# Patient Record
Sex: Male | Born: 1965 | Race: White | Hispanic: No | Marital: Married | Smoking: Current every day smoker
Health system: Southern US, Community
[De-identification: ages and names within clinical notes are randomized; demographics above are authoritative.]

## PROBLEM LIST (undated history)

## (undated) DIAGNOSIS — I209 Angina pectoris, unspecified: Secondary | ICD-10-CM

## (undated) DIAGNOSIS — E7800 Pure hypercholesterolemia, unspecified: Secondary | ICD-10-CM

## (undated) DIAGNOSIS — I10 Essential (primary) hypertension: Secondary | ICD-10-CM

## (undated) DIAGNOSIS — K219 Gastro-esophageal reflux disease without esophagitis: Secondary | ICD-10-CM

## (undated) DIAGNOSIS — I2510 Atherosclerotic heart disease of native coronary artery without angina pectoris: Secondary | ICD-10-CM

## (undated) DIAGNOSIS — E119 Type 2 diabetes mellitus without complications: Secondary | ICD-10-CM

## (undated) HISTORY — DX: Atherosclerotic heart disease of native coronary artery without angina pectoris: I25.10

## (undated) HISTORY — PX: NO PAST SURGERIES: SHX2092

---

## 2015-10-30 ENCOUNTER — Emergency Department (HOSPITAL_COMMUNITY): Payer: Self-pay

## 2015-10-30 ENCOUNTER — Encounter (HOSPITAL_COMMUNITY): Payer: Self-pay | Admitting: Emergency Medicine

## 2015-10-30 ENCOUNTER — Emergency Department (HOSPITAL_COMMUNITY)
Admission: EM | Admit: 2015-10-30 | Discharge: 2015-10-31 | Disposition: A | Discharge status: Home / Self Care | Payer: Self-pay | Attending: Emergency Medicine | Admitting: Emergency Medicine

## 2015-10-30 DIAGNOSIS — F17210 Nicotine dependence, cigarettes, uncomplicated: Secondary | ICD-10-CM | POA: Insufficient documentation

## 2015-10-30 DIAGNOSIS — L03211 Cellulitis of face: Secondary | ICD-10-CM | POA: Insufficient documentation

## 2015-10-30 DIAGNOSIS — I10 Essential (primary) hypertension: Secondary | ICD-10-CM | POA: Insufficient documentation

## 2015-10-30 DIAGNOSIS — Z792 Long term (current) use of antibiotics: Secondary | ICD-10-CM | POA: Insufficient documentation

## 2015-10-30 HISTORY — DX: Essential (primary) hypertension: I10

## 2015-10-30 LAB — COMPREHENSIVE METABOLIC PANEL
ALT: 29 U/L (ref 17–63)
AST: 17 U/L (ref 15–41)
Albumin: 3.5 g/dL (ref 3.5–5.0)
Alkaline Phosphatase: 98 U/L (ref 38–126)
Anion gap: 8 (ref 5–15)
BILIRUBIN TOTAL: 0.3 mg/dL (ref 0.3–1.2)
BUN: 8 mg/dL (ref 6–20)
CO2: 29 mmol/L (ref 22–32)
Calcium: 8.9 mg/dL (ref 8.9–10.3)
Chloride: 102 mmol/L (ref 101–111)
Creatinine, Ser: 0.94 mg/dL (ref 0.61–1.24)
Glucose, Bld: 157 mg/dL — ABNORMAL HIGH (ref 65–99)
POTASSIUM: 4 mmol/L (ref 3.5–5.1)
Sodium: 139 mmol/L (ref 135–145)
TOTAL PROTEIN: 6.7 g/dL (ref 6.5–8.1)

## 2015-10-30 LAB — CBC
HEMATOCRIT: 44.4 % (ref 39.0–52.0)
Hemoglobin: 15.3 g/dL (ref 13.0–17.0)
MCH: 29.7 pg (ref 26.0–34.0)
MCHC: 34.5 g/dL (ref 30.0–36.0)
MCV: 86 fL (ref 78.0–100.0)
PLATELETS: 281 10*3/uL (ref 150–400)
RBC: 5.16 MIL/uL (ref 4.22–5.81)
RDW: 13.2 % (ref 11.5–15.5)
WBC: 10.6 10*3/uL — AB (ref 4.0–10.5)

## 2015-10-30 MED ORDER — IOHEXOL 300 MG/ML  SOLN
100.0000 mL | Freq: Once | INTRAMUSCULAR | Status: AC | PRN
  Administered 2015-10-30: 100 mL via INTRAVENOUS

## 2015-10-30 MED ORDER — HYDROMORPHONE HCL 1 MG/ML IJ SOLN
1.0000 mg | Freq: Once | INTRAMUSCULAR | Status: AC
  Administered 2015-10-30: 1 mg via INTRAVENOUS
  Filled 2015-10-30: qty 1

## 2015-10-30 MED ORDER — VANCOMYCIN HCL IN DEXTROSE 1-5 GM/200ML-% IV SOLN
1000.0000 mg | Freq: Once | INTRAVENOUS | Status: AC
  Administered 2015-10-30: 1000 mg via INTRAVENOUS
  Filled 2015-10-30: qty 200

## 2015-10-30 NOTE — ED Notes (Signed)
Pt states two days ago he started having pain on the right side of his nose and over the past day the swelling has moved over on the right side of his face and under his eye. Pt was seen at urgent care today and given antibiotics  And told it was a staph infection. Pt here because pain is worse.

## 2015-10-30 NOTE — ED Notes (Signed)
Patient transported to CT 

## 2015-10-30 NOTE — ED Notes (Signed)
Pt transported to CT ?

## 2015-10-30 NOTE — ED Provider Notes (Signed)
CSN: [id number]     Arrival date & time 10/30/15  1908 History   First MD Initiated Contact with Patient 10/30/15 2130     Chief Complaint  Patient presents with  . Facial Swelling    HPI Patient presents to the emergency room with complaints of right-sided facial pain.  Patient states in the past day or so he noticed a bump on the inside of his right nostril but has been painful and tender. Over the past day it has been increasing in size and now he is having swelling involving the right side of his face as well as underneath his eye. He went to an urgent care today and was told he had a staph infection and was prescribed antibiotics. She feels like the symptoms have been getting worse and is having increasing pain so he came into the emergency room for evaluation. As any fevers or chills. No difficulty breathing. Past Medical History  Diagnosis Date  . Hypertension    History reviewed. No pertinent past surgical history. No family history on file. Social History  Substance Use Topics  . Smoking status: Current Every Day Smoker -- 1.00 packs/day    Types: Cigarettes  . Smokeless tobacco: None  . Alcohol Use: No    Review of Systems  All other systems reviewed and are negative.     Allergies  Review of patient's allergies indicates no known allergies.  Home Medications   Prior to Admission medications   Medication Sig Start Date End Date Taking? Authorizing Provider  mupirocin ointment (BACTROBAN) 2 % Place 1 application into the nose 2 (two) times daily as needed (FOR IRRITATION).   Yes Historical Provider, MD  sulfamethoxazole-trimethoprim (BACTRIM DS,SEPTRA DS) 800-160 MG tablet Take 1 tablet by mouth 2 (two) times daily.   Yes Historical Provider, MD  naproxen (NAPROSYN) 500 MG tablet Take 1 tablet (500 mg total) by mouth 2 (two) times daily. 10/31/15   Linwood DibblesJon Shandale Malak, MD  oxyCODONE-acetaminophen (PERCOCET/ROXICET) 5-325 MG tablet Take 1-2 tablets by mouth every 6 (six) hours as  needed. 10/31/15   Linwood DibblesJon Sharol Croghan, MD   BP 140/99 mmHg  Pulse 84  Temp(Src) 97.5 F (36.4 C) (Oral)  Resp 18  Ht 5\' 7"  (1.702 m)  Wt 109.77 kg  BMI 37.89 kg/m2  SpO2 97% Physical Exam  Constitutional: He appears well-developed and well-nourished. No distress.  HENT:  Head: Normocephalic and atraumatic.  Right Ear: External ear normal.  Left Ear: External ear normal.  Eyes: Conjunctivae are normal. Right eye exhibits no discharge. Left eye exhibits no discharge. No scleral icterus.  Neck: Neck supple. No tracheal deviation present.  Cardiovascular: Normal rate, regular rhythm and intact distal pulses.   Pulmonary/Chest: Effort normal and breath sounds normal. No stridor. No respiratory distress. He has no wheezes. He has no rales.  Abdominal: Soft. Bowel sounds are normal. He exhibits no distension. There is no tenderness. There is no rebound and no guarding.  Musculoskeletal: He exhibits no edema or tenderness.  Neurological: He is alert. He has normal strength. No cranial nerve deficit (no facial droop, extraocular movements intact, no slurred speech) or sensory deficit. He exhibits normal muscle tone. He displays no seizure activity. Coordination normal.  Skin: Skin is warm and dry. No rash noted.  Psychiatric: He has a normal mood and affect.  Nursing note and vitals reviewed.   ED Course  Procedures (including critical care time) Labs Review Labs Reviewed  COMPREHENSIVE METABOLIC PANEL - Abnormal; Notable for the following:  Glucose, Bld 157 (*)    All other components within normal limits  CBC - Abnormal; Notable for the following:    WBC 10.6 (*)    All other components within normal limits    Imaging Review Ct Maxillofacial W/cm  10/30/2015  CLINICAL DATA:  Initial evaluation for acute right-sided facial swelling with pain. EXAM: CT MAXILLOFACIAL WITH CONTRAST TECHNIQUE: Multidetector CT imaging of the maxillofacial structures was performed with intravenous contrast.  Multiplanar CT image reconstructions were also generated. A small metallic BB was placed on the right temple in order to reliably differentiate right from left. CONTRAST:  OMNIPAQUE IOHEXOL 300 MG/ML  SOLN COMPARISON:  None. FINDINGS: There is focal soft tissue swelling with inflammatory stranding along the lateral aspect of the nose on the right with extension towards the right nasal vestibule. Lateral extension into the right face/infraorbital soft tissues. Overlying skin thickening. Findings suspicious for cellulitis. No rim enhancing or drainable fluid collection identified. No other significant soft tissue inflammatory changes within the face. Bony structures demonstrate no acute abnormality. Globes and orbits demonstrate no acute abnormality. Mild polypoid opacity within the inferior maxillary sinuses, left greater than right. Paranasal sinuses are otherwise largely clear. Trace right mastoid effusion. Left mastoid air cells are clear. Middle ear cavities clear. Salivary glands including the parotid glands and submandibular glands are normal. No pathologically enlarged lymph nodes within the visualized neck. Visualized portions of the brain demonstrate no acute abnormality. IMPRESSION: Focal soft tissue swelling with inflammatory stranding along the right lateral aspect of the nose/nasal vestibule, with lateral extension into the right face/infraorbital soft tissues. Findings concerning for cellulitis. No abscess or drainable fluid collection identified. No postseptal extension. Electronically Signed   By: Rise Mu M.D.   On: 10/30/2015 23:54   I have personally reviewed and evaluated these images and lab results as part of my medical decision-making.    MDM   Final diagnoses:  Facial cellulitis    Patient CT scan shows evidence of facial cellulitis without abscess. Patient's laboratory tests are reassuring. Times are stable.  On the emergency department he was given a dose of  vancomycin and IV pain medications.  Patient was prescribed Bactrim at the urgent care today. I think this is a reasonable antibiotic to continue at this point. He was given the option of hospitalization for continued pain control since his outpatient treatment and close follow-up. Patient is comfortable with discharge. I will give him a prescription for Percocet since the hydrocodone was not giving him adequate pain control.    Linwood Dibbles, MD 10/31/15 [phone]

## 2015-10-31 MED ORDER — NAPROXEN 500 MG PO TABS: 500.0000 mg | ORAL_TABLET | Freq: Two times a day (BID) | ORAL | Status: DC

## 2015-10-31 MED ORDER — OXYCODONE-ACETAMINOPHEN 5-325 MG PO TABS: 1.0000 | ORAL_TABLET | Freq: Four times a day (QID) | ORAL | Status: DC | PRN

## 2015-10-31 MED ORDER — KETOROLAC TROMETHAMINE 30 MG/ML IJ SOLN
30.0000 mg | Freq: Once | INTRAMUSCULAR | Status: AC
  Administered 2015-10-31: 30 mg via INTRAVENOUS
  Filled 2015-10-31: qty 1

## 2015-10-31 NOTE — Discharge Instructions (Signed)
Cellulitis °Cellulitis is an infection of the skin and the tissue under the skin. The infected area is usually red and tender. This happens most often in the arms and lower legs. °HOME CARE  °· Take your antibiotic medicine as told. Finish the medicine even if you start to feel better. °· Keep the infected arm or leg raised (elevated). °· Put a warm cloth on the area up to 4 times per day. °· Only take medicines as told by your doctor. °· Keep all doctor visits as told. °GET HELP IF: °· You see red streaks on the skin coming from the infected area. °· Your red area gets bigger or turns a dark color. °· Your bone or joint under the infected area is painful after the skin heals. °· Your infection comes back in the same area or different area. °· You have a puffy (swollen) bump in the infected area. °· You have new symptoms. °· You have a fever. °GET HELP RIGHT AWAY IF:  °· You feel very sleepy. °· You throw up (vomit) or have watery poop (diarrhea). °· You feel sick and have muscle aches and pains. °  °This information is not intended to replace advice given to you by your health care provider. Make sure you discuss any questions you have with your health care provider. °  °Document Released: 03/23/2008 Document Revised: 06/26/2015 Document Reviewed: 12/21/2011 °Elsevier Interactive Patient Education ©2016 [employer] ° °

## 2017-05-13 IMAGING — CT CT MAXILLOFACIAL W/ CM
3 of 4 series · 14 of 47 positions shown, 16 images · IV contrast (omnipaque)
Comparison: None.

CLINICAL DATA: Initial evaluation for acute right-sided facial
swelling with pain.

EXAM:
CT MAXILLOFACIAL WITH CONTRAST
TECHNIQUE: Multidetector CT imaging of the maxillofacial structures was
performed with intravenous contrast. Multiplanar CT image
reconstructions were also generated. A small metallic BB was placed
on the right temple in order to reliably differentiate right from
left.
CONTRAST:  100mL OMNIPAQUE IOHEXOL 300 MG/ML  SOLN

[Series 2: facialbone 2.0 st · axial · 0.35mm/px · z∈[-268,-110]mm · 8 of 93 slices shown, 10 images]
[im 7/93  brain]
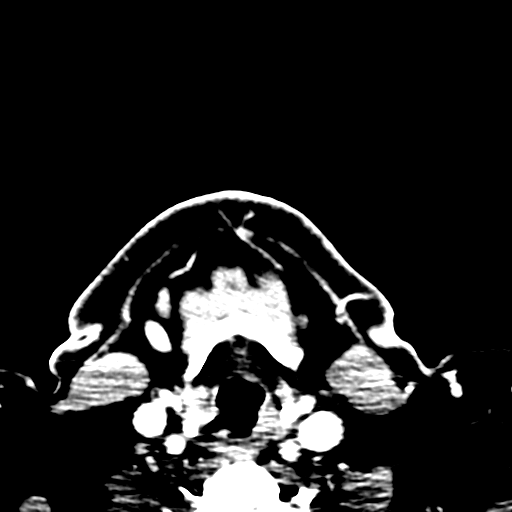
[im 7/93  bone]
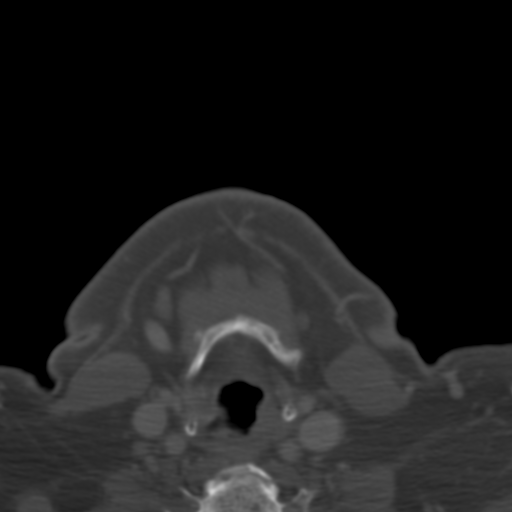
[im 20/93  bone]
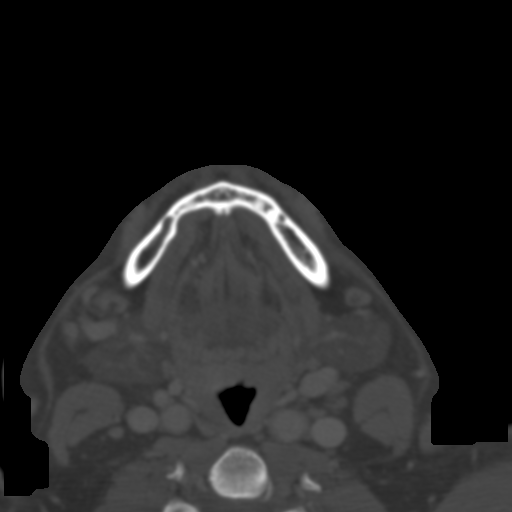
[im 33/93  bone]
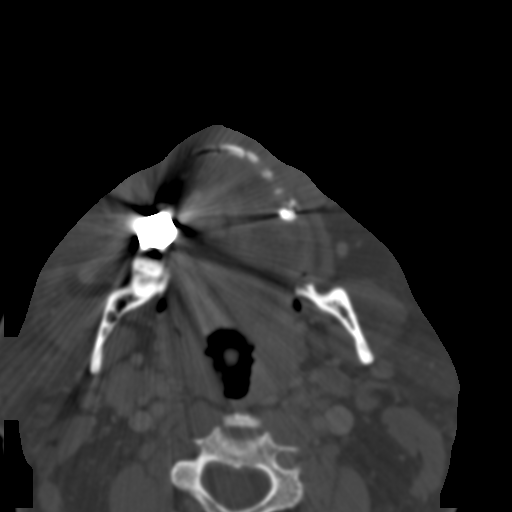
[im 40/93  bone]
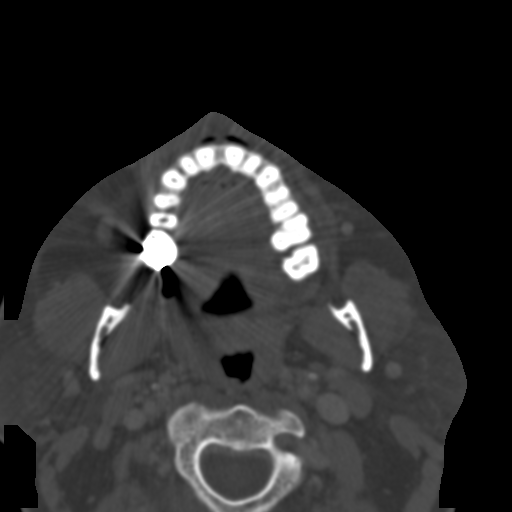
[im 53/93  brain]
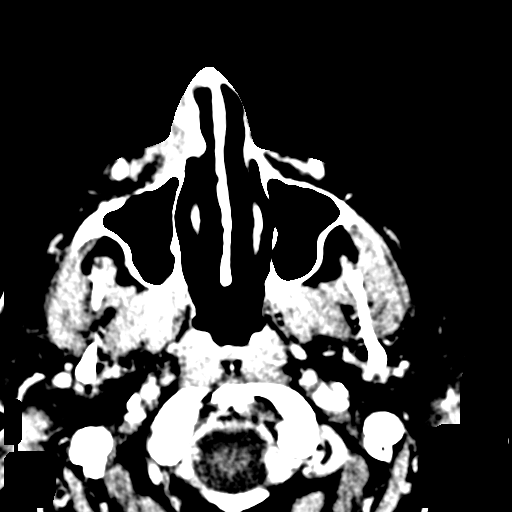
[im 53/93  bone]
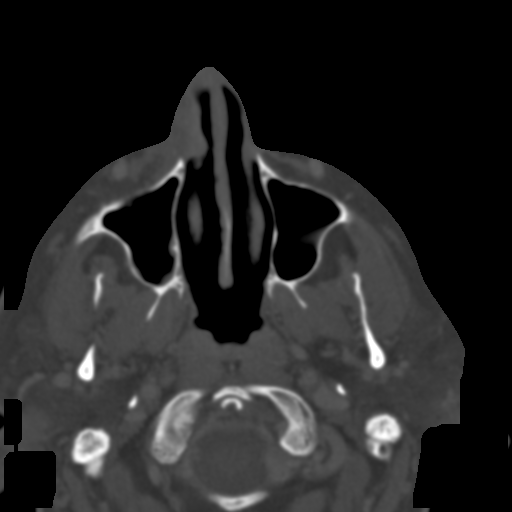
[im 60/93  bone]
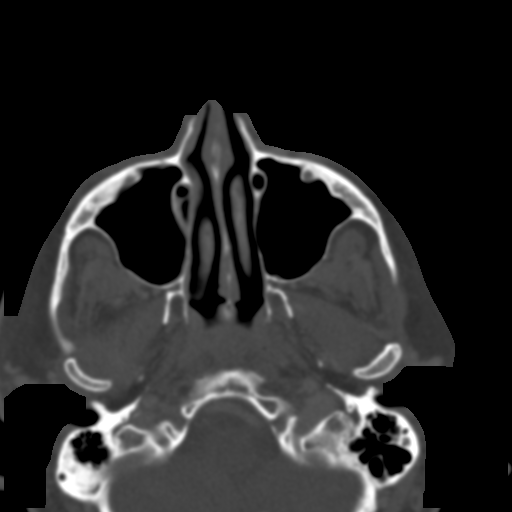
[im 73/93  bone]
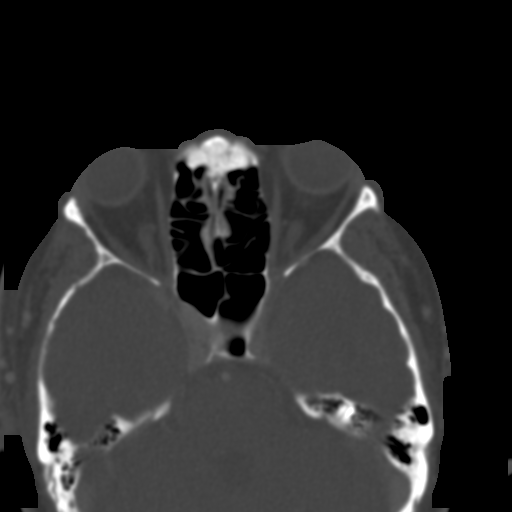
[im 86/93  bone]
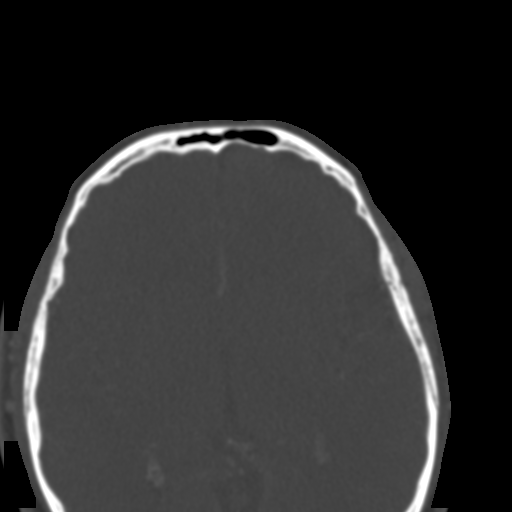

[Series 6: facialbone 2.0 cor st · coronal · 0.36mm/px · 3 of 105 slices shown]
[im 35/105  bone]
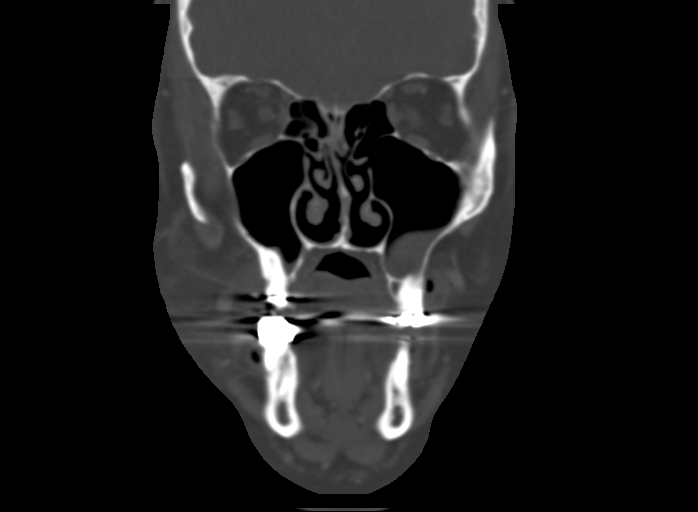
[im 47/105  bone]
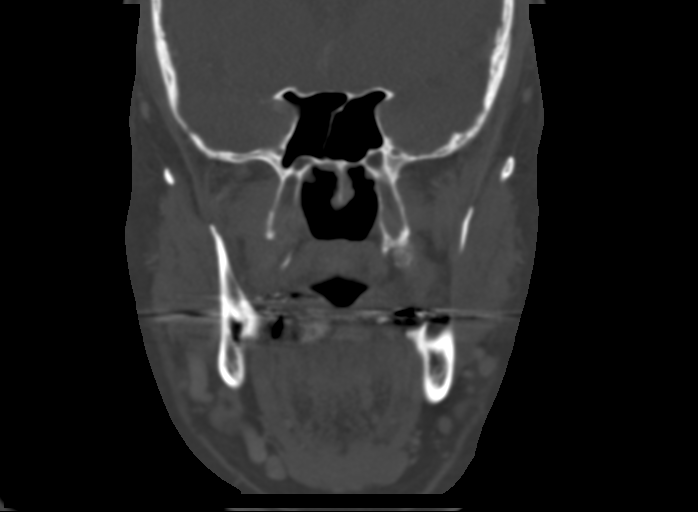
[im 58/105  bone]
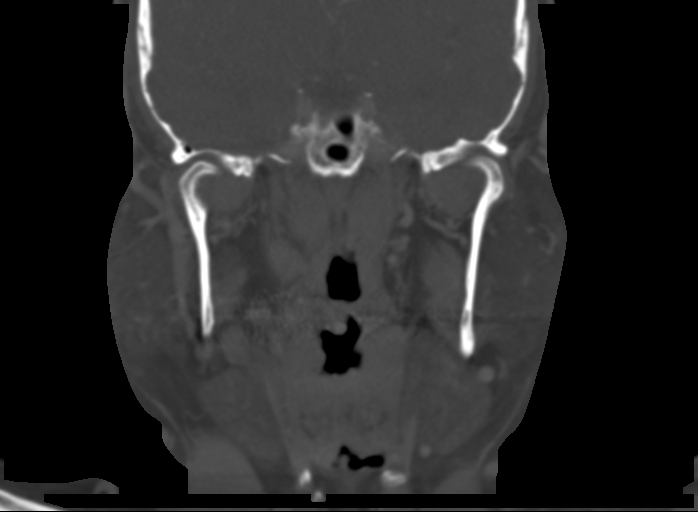

[Series 7: facialbone 2.0 sag st · sagittal · 0.36mm/px · 3 of 98 slices shown]
[im 33/98  bone]
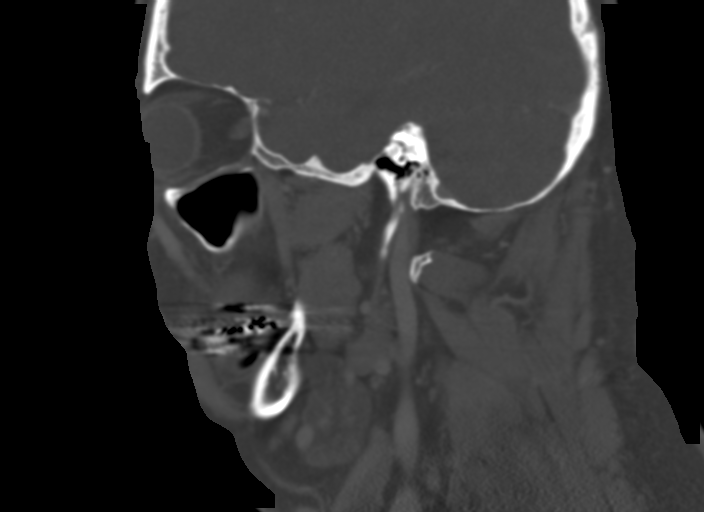
[im 49/98  bone]
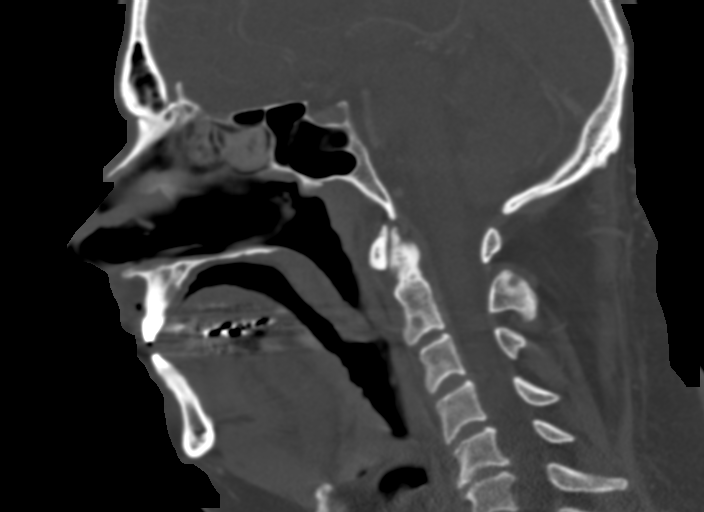
[im 65/98  bone]
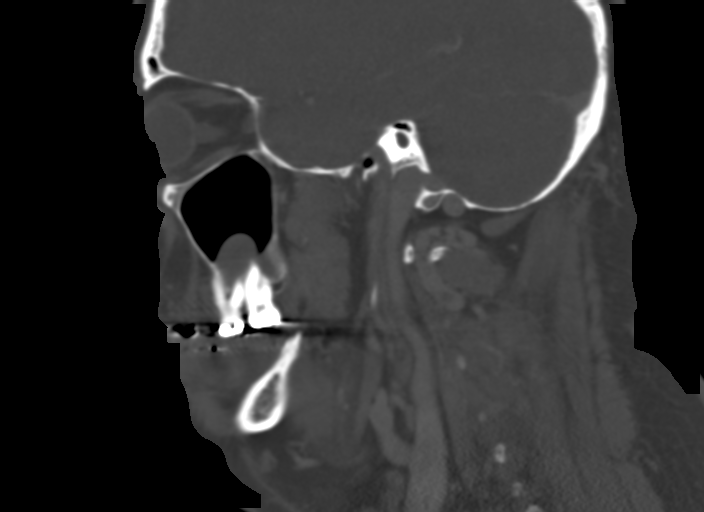

[14 of 47 positions shown; findings below may reference images not displayed]

FINDINGS: There is focal soft tissue swelling with inflammatory stranding
along the lateral aspect of the nose on the right with extension
towards the right nasal vestibule. Lateral extension into the right
face/infraorbital soft tissues. Overlying skin thickening. Findings
suspicious for cellulitis. No rim enhancing or drainable fluid
collection identified. No other significant soft tissue inflammatory
changes within the face.

Bony structures demonstrate no acute abnormality.

Globes and orbits demonstrate no acute abnormality.

Mild polypoid opacity within the inferior maxillary sinuses, left
greater than right. Paranasal sinuses are otherwise largely clear.
Trace right mastoid effusion. Left mastoid air cells are clear.
Middle ear cavities clear.

Salivary glands including the parotid glands and submandibular
glands are normal.

No pathologically enlarged lymph nodes within the visualized neck.

Visualized portions of the brain demonstrate no acute abnormality.
IMPRESSION: Focal soft tissue swelling with inflammatory stranding along the
right lateral aspect of the nose/nasal vestibule, with lateral
extension into the right face/infraorbital soft tissues. Findings
concerning for cellulitis. No abscess or drainable fluid collection
identified. No postseptal extension.

## 2017-09-08 DIAGNOSIS — I200 Unstable angina: Secondary | ICD-10-CM | POA: Diagnosis not present

## 2017-09-08 DIAGNOSIS — I10 Essential (primary) hypertension: Secondary | ICD-10-CM

## 2017-09-08 DIAGNOSIS — E785 Hyperlipidemia, unspecified: Secondary | ICD-10-CM | POA: Diagnosis not present

## 2017-09-08 DIAGNOSIS — R079 Chest pain, unspecified: Secondary | ICD-10-CM | POA: Diagnosis not present

## 2017-09-08 DIAGNOSIS — R739 Hyperglycemia, unspecified: Secondary | ICD-10-CM

## 2017-09-09 ENCOUNTER — Other Ambulatory Visit: Payer: Self-pay

## 2017-09-09 ENCOUNTER — Encounter (HOSPITAL_COMMUNITY): Payer: Self-pay | Admitting: General Practice

## 2017-09-09 ENCOUNTER — Inpatient Hospital Stay (HOSPITAL_COMMUNITY)
Admission: AD | Admit: 2017-09-09 | Discharge: 2017-09-10 | DRG: 287 | Disposition: A | Discharge status: Home / Self Care | Payer: Medicaid Other | Source: Other Acute Inpatient Hospital | Attending: Internal Medicine | Admitting: Internal Medicine

## 2017-09-09 DIAGNOSIS — F17210 Nicotine dependence, cigarettes, uncomplicated: Secondary | ICD-10-CM | POA: Diagnosis present

## 2017-09-09 DIAGNOSIS — Z7984 Long term (current) use of oral hypoglycemic drugs: Secondary | ICD-10-CM

## 2017-09-09 DIAGNOSIS — Z720 Tobacco use: Secondary | ICD-10-CM

## 2017-09-09 DIAGNOSIS — I200 Unstable angina: Secondary | ICD-10-CM | POA: Diagnosis not present

## 2017-09-09 DIAGNOSIS — I25110 Atherosclerotic heart disease of native coronary artery with unstable angina pectoris: Principal | ICD-10-CM | POA: Diagnosis present

## 2017-09-09 DIAGNOSIS — R072 Precordial pain: Secondary | ICD-10-CM | POA: Diagnosis not present

## 2017-09-09 DIAGNOSIS — K219 Gastro-esophageal reflux disease without esophagitis: Secondary | ICD-10-CM | POA: Diagnosis present

## 2017-09-09 DIAGNOSIS — E785 Hyperlipidemia, unspecified: Secondary | ICD-10-CM

## 2017-09-09 DIAGNOSIS — Z791 Long term (current) use of non-steroidal anti-inflammatories (NSAID): Secondary | ICD-10-CM | POA: Diagnosis not present

## 2017-09-09 DIAGNOSIS — E119 Type 2 diabetes mellitus without complications: Secondary | ICD-10-CM

## 2017-09-09 DIAGNOSIS — Z79899 Other long term (current) drug therapy: Secondary | ICD-10-CM

## 2017-09-09 DIAGNOSIS — I10 Essential (primary) hypertension: Secondary | ICD-10-CM | POA: Diagnosis present

## 2017-09-09 DIAGNOSIS — E7800 Pure hypercholesterolemia, unspecified: Secondary | ICD-10-CM | POA: Diagnosis present

## 2017-09-09 DIAGNOSIS — E1165 Type 2 diabetes mellitus with hyperglycemia: Secondary | ICD-10-CM | POA: Diagnosis present

## 2017-09-09 DIAGNOSIS — R079 Chest pain, unspecified: Secondary | ICD-10-CM | POA: Diagnosis present

## 2017-09-09 HISTORY — DX: Pure hypercholesterolemia, unspecified: E78.00

## 2017-09-09 HISTORY — DX: Angina pectoris, unspecified: I20.9

## 2017-09-09 HISTORY — DX: Type 2 diabetes mellitus without complications: E11.9

## 2017-09-09 HISTORY — DX: Gastro-esophageal reflux disease without esophagitis: K21.9

## 2017-09-09 LAB — GLUCOSE, CAPILLARY: Glucose-Capillary: 235 mg/dL — ABNORMAL HIGH (ref 65–99)

## 2017-09-09 MED ORDER — NITROGLYCERIN 0.4 MG SL SUBL: 0.4000 mg | SUBLINGUAL_TABLET | SUBLINGUAL | Status: DC | PRN

## 2017-09-09 MED ORDER — HEPARIN BOLUS VIA INFUSION
4000.0000 [IU] | Freq: Once | INTRAVENOUS | Status: AC
  Administered 2017-09-09: 4000 [IU] via INTRAVENOUS
  Filled 2017-09-09: qty 4000

## 2017-09-09 MED ORDER — METOPROLOL TARTRATE 12.5 MG HALF TABLET
12.5000 mg | ORAL_TABLET | Freq: Two times a day (BID) | ORAL | Status: DC
  Administered 2017-09-09 – 2017-09-10 (×2): 12.5 mg via ORAL
  Filled 2017-09-09 (×2): qty 1

## 2017-09-09 MED ORDER — HEPARIN (PORCINE) IN NACL 100-0.45 UNIT/ML-% IJ SOLN
1200.0000 [IU]/h | INTRAMUSCULAR | Status: DC
  Administered 2017-09-09 – 2017-09-10 (×2): 1200 [IU]/h via INTRAVENOUS
  Filled 2017-09-09 (×2): qty 250

## 2017-09-09 MED ORDER — ONDANSETRON HCL 4 MG/2ML IJ SOLN: 4.0000 mg | Freq: Four times a day (QID) | INTRAMUSCULAR | Status: DC | PRN

## 2017-09-09 MED ORDER — INSULIN ASPART 100 UNIT/ML ~~LOC~~ SOLN
0.0000 [IU] | Freq: Three times a day (TID) | SUBCUTANEOUS | Status: DC
  Administered 2017-09-10: 5 [IU] via SUBCUTANEOUS
  Administered 2017-09-10: 3 [IU] via SUBCUTANEOUS

## 2017-09-09 MED ORDER — ACETAMINOPHEN 325 MG PO TABS: 650.0000 mg | ORAL_TABLET | ORAL | Status: DC | PRN

## 2017-09-09 MED ORDER — ASPIRIN EC 81 MG PO TBEC
81.0000 mg | DELAYED_RELEASE_TABLET | Freq: Every day | ORAL | Status: DC
  Administered 2017-09-10: 81 mg via ORAL
  Filled 2017-09-09: qty 1

## 2017-09-09 NOTE — H&P (Signed)
History & Physical    Patient ID: Shane Merritt MRN: [id number], DOB/AGE: 50/12/1965   Admit date: 09/09/2017   Primary Physician: Leane CallNodal, Jr Reinaldo, PA-C Primary Cardiologist: New  Patient Profile    51 yo male with PMH of DM, HTN, HL and tobacco use who [facility] with [facility].  Past Medical History   Past Medical History:  Diagnosis Date  . Anginal pain (HCC)   . GERD (gastroesophageal reflux disease)   . Hypercholesterolemia    Hattie Perch/notes 10/18/2011  . Hypertension   . Type II diabetes mellitus (HCC)    "started RX last week" (09/09/2017)    Past Surgical History:  Procedure Laterality Date  . NO PAST SURGERIES       Allergies  No Known Allergies  History of Present Illness    Shane Merritt is 51 yo male with PMH of DM, HTN, HL and tobacco use. Reports he has not followed with a PCP for over a year. Was seen by his PCP last week and placed on antihypertensives, statin and metformin. He was scheduled to follow up in the office yesterday for a blood sugar check. Reports that morning he was loading a truck and developed a sudden onset of SSCP with severe dyspnea, nausea and diaphoresis. He sat down for awhile but the pain continued. States this lasted about 2 hours. He went to the PCP to follow up about his blood sugar and informed PCP of events, [facility] and [facility].   Troponin cycled and negative x3, EKG not on file. CXR was negative. ECHO showed EF of 45-50% with mid lateral, apical lateral WMA. In talking with the patient he reports having episodes of chest pain for several weeks prior to yesterday's, reporting about one a week with exertion. Denies any family hx of CAD.   Home Medications    Prior to Admission medications   Medication Sig Start Date End Date Taking? Authorizing Provider  mupirocin ointment (BACTROBAN) 2 % Place 1 application into the nose 2 (two) times daily as needed (FOR IRRITATION).    [provider]  naproxen (NAPROSYN) 500 MG tablet Take 1 tablet (500 mg total) by mouth 2 (two) times daily. 10/31/15   Linwood DibblesKnapp, Jon, MD  oxyCODONE-acetaminophen (PERCOCET/ROXICET) 5-325 MG tablet Take 1-2 tablets by mouth every 6 (six) hours as needed. 10/31/15   Linwood DibblesKnapp, Jon, MD  sulfamethoxazole-trimethoprim (BACTRIM DS,SEPTRA DS) 800-160 MG tablet Take 1 tablet by mouth 2 (two) times daily.    [provider]    Family History    Family History  Problem Relation Age of Onset  . Hypertension Father     Social History    Social History   Socioeconomic History  . Marital status: Married    Spouse name: Not on file  . Number of children: Not on file  . Years of education: Not on file  . Highest education level: Not on file  Social Needs  . Financial resource strain: Not on file  . Food insecurity - worry: Not on file  . Food insecurity - inability: Not on file  . Transportation needs - medical: Not on file  . Transportation needs - non-medical: Not on file  Occupational History  . Not on file  Tobacco Use  . Smoking status: Current Every Day Smoker    Packs/day: 1.00    Types: Cigarettes  . Smokeless tobacco: Never Used  Substance and Sexual Activity  . Alcohol use: No  .  Drug use: No  . Sexual activity: Yes  Other Topics Concern  . Not on file  Social History Narrative  . Not on file     Review of Systems    See HPI  All other systems reviewed and are otherwise negative except as noted above.  Physical Exam    There were no vitals taken for this visit.  General: Pleasant, NAD Psych: Normal affect. Neuro: Alert and oriented X 3. Moves all extremities spontaneously. HEENT: Normal  Neck: Supple without bruits or JVD. Lungs:  Resp regular and unlabored, CTA. Heart: RRR no s3, s4, or murmurs. Abdomen: Soft, non-tender, non-distended, BS + x 4.  Extremities: No clubbing, cyanosis or edema. DP/PT/Radials 2+ and equal bilaterally.  Labs    Troponin  (Point of Care Test) No results for input(s): TROPIPOC in the last 72 hours. No results for input(s): CKTOTAL, CKMB, TROPONINI in the last 72 hours. Lab Results  Component Value Date   WBC 10.6 (H) 10/30/2015   HGB 15.3 10/30/2015   HCT 44.4 10/30/2015   MCV 86.0 10/30/2015   PLT 281 10/30/2015   No results for input(s): NA, K, CL, CO2, BUN, CREATININE, CALCIUM, PROT, BILITOT, ALKPHOS, ALT, AST, GLUCOSE in the last 168 hours.  Invalid input(s): LABALBU No results found for: CHOL, HDL, LDLCALC, TRIG No results found for: [facility]   Radiology Studies    No results found.  ECG & Cardiac Imaging    Echo: at [facility] in chart  EF 45-50% with mid lateral/apical lateral WMA  Assessment & Plan    51 yo male with PMH of DM, HTN, HL and tobacco use who presented to [facility] with chest pain.  1. Unstable Angina: Reports several episode of exertional chest pain over the past month. Episode yesterday while unloading a truck. Transferred from [place]. Trops negative x3. EKG not on file. Echo with WMA. CRFs and hx is concerning for ACS. Given findings would recommend cardiac cath.  -- plan for cath tomorrow -- The patient understands that risks included but are not limited to stroke (1 in 1000), death (1 in 1000), kidney failure [usually temporary] (1 in 500), bleeding (1 in 200), allergic reaction [possibly serious] (1 in 200).  -- will start IV heparin with symptoms and concern for ACS  2. HTN: was on HCTZ per patient report, will hold to allow room for low dose metoprolol 12.5mg  BID  3. HL: will repeat Lipid panel as LDL was reported at 26 at [facility]  4. DM: hold metformin, SSI while inpatient  5. Tobacco use: cessation advised, states he is ready to quit  Signed, Laverda Page, NP-C Pager [phone] 09/09/2017, 5:39 PM   I have seen, examined the patient, and reviewed the above assessment and plan.  Changes to above are made where necessary.  On exam, RRR.   Comfortable and without chest pain currently.  Discussed at length with Dr Robby Sermon today.  I agree that given multiple CAD risk factors including poorly controlled diabetes, ongoing tobacco, and HTN as well as his classic story for unstable angina that cath is advised. I would therefore recommend left heart catheterization with possible PCI.  Discussed the cath with the patient. The patient understands that risks included but are not limited to stroke (1 in 1000), death (1 in 1000), kidney failure [usually temporary] (1 in 500), bleeding (1 in 200), allergic reaction [possibly serious] (1 in 200). The patient understands and agrees to proceed.  Will place on IV heparin overnight.  Co Sign: Hillis RangeJames Eddye Broxterman, MD 09/09/2017 5:51 PM

## 2017-09-09 NOTE — Progress Notes (Signed)
ANTICOAGULATION CONSULT NOTE - Initial Consult  Pharmacy Consult for heparin Indication: chest pain/ACS  No Known Allergies  Patient Measurements: Height: 5\' 10"  (177.8 cm) Weight: 226 lb 10.1 oz (102.8 kg) IBW/kg (Calculated) : 73 Heparin Dosing Weight: 95kg  Vital Signs: BP: 122/77 (11/22 1807)  Labs: No results for input(s): HGB, HCT, PLT, APTT, LABPROT, INR, HEPARINUNFRC, HEPRLOWMOCWT, CREATININE, CKTOTAL, CKMB, TROPONINI in the last 72 hours.  CrCl cannot be calculated (Patient's most recent lab result is older than the maximum 21 days allowed.).    Assessment: [age] YOM here with CP- has been having for ~1 weeks PTA, [facility] and then transferred here. Troponins negative. Starting IV heparin - not on anticoagulation at home. No labs drawn yet.  Goal of Therapy:  Heparin level 0.3-0.7 units/ml Monitor platelets by anticoagulation protocol: Yes   Plan:  Heparin bolus with 4000 units IV x1, then start infusion at 1200 units/hr Heparin level in 6 hours Daily heparin level and CBC Follow up after cath  Laikyn Gewirtz D. Savonna Birchmeier, PharmD, BCPS Clinical Pharmacist [phone] 09/09/2017 6:20 PM

## 2017-09-10 ENCOUNTER — Encounter (HOSPITAL_COMMUNITY)
Admission: AD | Disposition: A | Discharge status: Home / Self Care | Payer: Self-pay | Source: Other Acute Inpatient Hospital | Attending: Internal Medicine

## 2017-09-10 ENCOUNTER — Encounter (HOSPITAL_COMMUNITY): Payer: Self-pay | Admitting: Cardiovascular Disease

## 2017-09-10 DIAGNOSIS — E785 Hyperlipidemia, unspecified: Secondary | ICD-10-CM

## 2017-09-10 DIAGNOSIS — E119 Type 2 diabetes mellitus without complications: Secondary | ICD-10-CM

## 2017-09-10 DIAGNOSIS — R072 Precordial pain: Secondary | ICD-10-CM

## 2017-09-10 DIAGNOSIS — I10 Essential (primary) hypertension: Secondary | ICD-10-CM

## 2017-09-10 HISTORY — PX: LEFT HEART CATH AND CORONARY ANGIOGRAPHY: CATH118249

## 2017-09-10 LAB — HEPARIN LEVEL (UNFRACTIONATED)
HEPARIN UNFRACTIONATED: 0.33 [IU]/mL (ref 0.30–0.70)
HEPARIN UNFRACTIONATED: 0.37 [IU]/mL (ref 0.30–0.70)

## 2017-09-10 LAB — CBC
HCT: 44.7 % (ref 39.0–52.0)
Hemoglobin: 15.5 g/dL (ref 13.0–17.0)
MCH: 29.9 pg (ref 26.0–34.0)
MCHC: 34.7 g/dL (ref 30.0–36.0)
MCV: 86.3 fL (ref 78.0–100.0)
Platelets: 219 10*3/uL (ref 150–400)
RBC: 5.18 MIL/uL (ref 4.22–5.81)
RDW: 13.1 % (ref 11.5–15.5)
WBC: 7.5 10*3/uL (ref 4.0–10.5)

## 2017-09-10 LAB — BASIC METABOLIC PANEL
Anion gap: 7 (ref 5–15)
BUN: 12 mg/dL (ref 6–20)
CALCIUM: 8.6 mg/dL — AB (ref 8.9–10.3)
CO2: 24 mmol/L (ref 22–32)
CREATININE: 0.82 mg/dL (ref 0.61–1.24)
Chloride: 105 mmol/L (ref 101–111)
Glucose, Bld: 255 mg/dL — ABNORMAL HIGH (ref 65–99)
Potassium: 3.7 mmol/L (ref 3.5–5.1)
SODIUM: 136 mmol/L (ref 135–145)

## 2017-09-10 LAB — LIPID PANEL
CHOL/HDL RATIO: 7.1 ratio
Cholesterol: 243 mg/dL — ABNORMAL HIGH (ref 0–200)
HDL: 34 mg/dL — ABNORMAL LOW (ref >40)
LDL CALC: UNDETERMINED mg/dL (ref 0–99)
Triglycerides: 589 mg/dL — ABNORMAL HIGH (ref <150)
VLDL: UNDETERMINED mg/dL (ref 0–40)

## 2017-09-10 LAB — PROTIME-INR
INR: 0.93
Prothrombin Time: 12.4 seconds (ref 11.4–15.2)

## 2017-09-10 LAB — GLUCOSE, CAPILLARY
GLUCOSE-CAPILLARY: 221 mg/dL — AB (ref 65–99)
GLUCOSE-CAPILLARY: 196 mg/dL — AB (ref 65–99)
Glucose-Capillary: 180 mg/dL — ABNORMAL HIGH (ref 65–99)

## 2017-09-10 LAB — HIV ANTIBODY (ROUTINE TESTING W REFLEX): HIV Screen 4th Generation wRfx: NONREACTIVE

## 2017-09-10 SURGERY — LEFT HEART CATH AND CORONARY ANGIOGRAPHY
Anesthesia: LOCAL

## 2017-09-10 MED ORDER — SODIUM CHLORIDE 0.9% FLUSH: 3.0000 mL | Freq: Two times a day (BID) | INTRAVENOUS | Status: DC

## 2017-09-10 MED ORDER — ATORVASTATIN CALCIUM 80 MG PO TABS: 80.0000 mg | ORAL_TABLET | Freq: Every day | ORAL | 3 refills | Status: DC

## 2017-09-10 MED ORDER — LIDOCAINE HCL (PF) 1 % IJ SOLN
INTRAMUSCULAR | Status: AC
  Filled 2017-09-10: qty 30

## 2017-09-10 MED ORDER — ATORVASTATIN CALCIUM 80 MG PO TABS: 80.0000 mg | ORAL_TABLET | Freq: Every day | ORAL | Status: DC

## 2017-09-10 MED ORDER — SODIUM CHLORIDE 0.9 % WEIGHT BASED INFUSION
1.0000 mL/kg/h | INTRAVENOUS | Status: DC
  Administered 2017-09-10: 1 mL/kg/h via INTRAVENOUS

## 2017-09-10 MED ORDER — METOPROLOL TARTRATE 25 MG PO TABS: 12.5000 mg | ORAL_TABLET | Freq: Two times a day (BID) | ORAL | 5 refills | Status: DC

## 2017-09-10 MED ORDER — HEPARIN (PORCINE) IN NACL 2-0.9 UNIT/ML-% IJ SOLN
INTRAMUSCULAR | Status: AC
  Filled 2017-09-10: qty 1000

## 2017-09-10 MED ORDER — SODIUM CHLORIDE 0.9 % WEIGHT BASED INFUSION: 1.0000 mL/kg/h | INTRAVENOUS | Status: DC

## 2017-09-10 MED ORDER — SODIUM CHLORIDE 0.9% FLUSH: 3.0000 mL | INTRAVENOUS | Status: DC | PRN

## 2017-09-10 MED ORDER — FENTANYL CITRATE (PF) 100 MCG/2ML IJ SOLN
INTRAMUSCULAR | Status: DC | PRN
  Administered 2017-09-10: 25 ug via INTRAVENOUS

## 2017-09-10 MED ORDER — IOPAMIDOL (ISOVUE-370) INJECTION 76%
INTRAVENOUS | Status: AC
  Filled 2017-09-10: qty 100

## 2017-09-10 MED ORDER — MIDAZOLAM HCL 2 MG/2ML IJ SOLN
INTRAMUSCULAR | Status: DC | PRN
  Administered 2017-09-10: 2 mg via INTRAVENOUS

## 2017-09-10 MED ORDER — OMEGA-3-ACID ETHYL ESTERS 1 G PO CAPS: 1.0000 g | ORAL_CAPSULE | Freq: Two times a day (BID) | ORAL | 11 refills | Status: DC

## 2017-09-10 MED ORDER — LIDOCAINE HCL (PF) 1 % IJ SOLN
INTRAMUSCULAR | Status: DC | PRN
  Administered 2017-09-10: 2 mL

## 2017-09-10 MED ORDER — SODIUM CHLORIDE 0.9 % WEIGHT BASED INFUSION: 3.0000 mL/kg/h | INTRAVENOUS | Status: DC

## 2017-09-10 MED ORDER — VERAPAMIL HCL 2.5 MG/ML IV SOLN
INTRAVENOUS | Status: AC
  Filled 2017-09-10: qty 2

## 2017-09-10 MED ORDER — SODIUM CHLORIDE 0.9 % IV SOLN: 250.0000 mL | INTRAVENOUS | Status: DC | PRN

## 2017-09-10 MED ORDER — SODIUM CHLORIDE 0.9% FLUSH
3.0000 mL | Freq: Two times a day (BID) | INTRAVENOUS | Status: DC
  Administered 2017-09-10: 3 mL via INTRAVENOUS

## 2017-09-10 MED ORDER — FENTANYL CITRATE (PF) 100 MCG/2ML IJ SOLN
INTRAMUSCULAR | Status: AC
  Filled 2017-09-10: qty 2

## 2017-09-10 MED ORDER — HEPARIN SODIUM (PORCINE) 1000 UNIT/ML IJ SOLN
INTRAMUSCULAR | Status: DC | PRN
  Administered 2017-09-10: 5000 [IU] via INTRAVENOUS

## 2017-09-10 MED ORDER — VERAPAMIL HCL 2.5 MG/ML IV SOLN
INTRAVENOUS | Status: DC | PRN
  Administered 2017-09-10: 10 mL via INTRA_ARTERIAL

## 2017-09-10 MED ORDER — HEPARIN SODIUM (PORCINE) 1000 UNIT/ML IJ SOLN
INTRAMUSCULAR | Status: AC
  Filled 2017-09-10: qty 1

## 2017-09-10 MED ORDER — IOPAMIDOL (ISOVUE-370) INJECTION 76%
INTRAVENOUS | Status: DC | PRN
  Administered 2017-09-10: 60 mL via INTRA_ARTERIAL

## 2017-09-10 MED ORDER — HEPARIN (PORCINE) IN NACL 2-0.9 UNIT/ML-% IJ SOLN
INTRAMUSCULAR | Status: AC | PRN
  Administered 2017-09-10: 1000 mL

## 2017-09-10 MED ORDER — MIDAZOLAM HCL 2 MG/2ML IJ SOLN
INTRAMUSCULAR | Status: AC
  Filled 2017-09-10: qty 2

## 2017-09-10 MED ORDER — SODIUM CHLORIDE 0.9 % WEIGHT BASED INFUSION: 3.0000 mL/kg/h | INTRAVENOUS | Status: AC

## 2017-09-10 MED ORDER — ASPIRIN 81 MG PO TBEC: 81.0000 mg | DELAYED_RELEASE_TABLET | Freq: Every day | ORAL | Status: DC

## 2017-09-10 MED ORDER — OMEGA-3-ACID ETHYL ESTERS 1 G PO CAPS: 1.0000 g | ORAL_CAPSULE | Freq: Two times a day (BID) | ORAL | Status: DC

## 2017-09-10 SURGICAL SUPPLY — 9 items
CATH 5FR JL3.5 JR4 ANG PIG MP (CATHETERS) ×2 IMPLANT
DEVICE RAD COMP TR BAND LRG (VASCULAR PRODUCTS) ×2 IMPLANT
GLIDESHEATH SLEND SS 6F .021 (SHEATH) ×2 IMPLANT
GUIDEWIRE INQWIRE 1.5J.035X260 (WIRE) ×1 IMPLANT
INQWIRE 1.5J .035X260CM (WIRE) ×2
KIT HEART LEFT (KITS) ×2 IMPLANT
PACK CARDIAC CATHETERIZATION (CUSTOM PROCEDURE TRAY) ×2 IMPLANT
TRANSDUCER W/STOPCOCK (MISCELLANEOUS) ×2 IMPLANT
TUBING CIL FLEX 10 FLL-RA (TUBING) ×2 IMPLANT

## 2017-09-10 NOTE — Care Management (Signed)
CM reviewed patient's record patient is uninsured, Medicaid peinding. CM met with patient at bedside to discuss follow up patient has PCP in [place] that he plans to call on Monday to arrange post hospital discharge follow up. Patient states he may need assistance with discharge prescriptions. [place] program and guidelines discussed patient is agreeable to terms of program including the $3 co-pay per prescription. Patient enrolled letter printed and handed to patient prior to discharge with instructions on how to redeem patient verbalized understanding and teach back done.  No further CM needs identified.

## 2017-09-10 NOTE — Progress Notes (Signed)
ANTICOAGULATION CONSULT NOTE - Follow Up Consult  Pharmacy Consult for Heparin  Indication: chest pain/ACS  No Known Allergies  Patient Measurements: Height: 5\' 10"  (177.8 cm) Weight: 226 lb 10.1 oz (102.8 kg) IBW/kg (Calculated) : 73  Vital Signs: Temp: 98.6 F (37 C) (11/23 0028) Temp Source: Oral (11/23 0028) BP: 144/81 (11/23 0028) Pulse Rate: 79 (11/23 0028)  Labs: Recent Labs    09/10/17 0052 09/10/17 0112  HGB 15.5  --   HCT 44.7  --   PLT 219  --   HEPARINUNFRC  --  0.33   Assessment: Heparin for CP, initial heparin level is therapeutic this AM  Goal of Therapy:  Heparin level 0.3-0.7 units/ml Monitor platelets by anticoagulation protocol: Yes   Plan:  Cont heparin at 1200 units/hr Confirmatory heparin level with AM labs  Shane Merritt, Shane Merritt 09/10/2017,1:46 AM

## 2017-09-10 NOTE — Progress Notes (Signed)
Discharge teaching complete. Meds, diet, activity, follow up appointments and TR band instructions reviewed and all questions answered. Copy of instructions given to patient. Prescriptions sent to pharmacy.

## 2017-09-10 NOTE — Discharge Summary (Signed)
Discharge Summary    Patient ID: Shane Merritt,  MRN: [id number], DOB/AGE: 50/12/1965 50 y.o.  Admit date: 09/09/2017 Discharge date: 09/10/2017  Primary Care Provider: Leane CallNodal, Jr Reinaldo Primary Cardiologist: new - Dr. Excell Seltzerooper  Discharge Diagnoses    Principal Problem:   Chest pain Active Problems:   Hypertension   Hyperlipidemia   DM II (diabetes mellitus, type II), controlled (HCC)   Allergies No Known Allergies  Diagnostic Studies/Procedures    Cath 09/10/2017 Conclusion   1.  Diffuse nonobstructive coronary artery disease without any evidence of high-grade coronary stenosis 2.  Normal LVEDP  Recommend: Medical therapy for nonobstructive coronary artery disease.  Suspect noncardiac chest pain.   Diagnostic Diagram       _____________   History of Present Illness     Shane Merritt is 51 yo male with PMH of DM, HTN, HL and tobacco use. Reports he has not followed with a PCP for over a year. Was seen by his PCP last week and placed on antihypertensives, statin and metformin. He was scheduled to follow up in the office yesterday for a blood sugar check. Reports that morning he was loading a truck and developed a sudden onset of SSCP with severe dyspnea, nausea and diaphoresis. He sat down for awhile but the pain continued. States this lasted about 2 hours. He went to the PCP to follow up about his blood sugar and informed PCP of events, was [facility] and [facility].   Troponin cycled and negative x3, EKG not on file. CXR was negative. ECHO showed EF of 45-50% with mid lateral, apical lateral WMA. In talking with the patient he reports having episodes of chest pain for several weeks prior to yesterday's, reporting about one a week with exertion. Denies any family hx of CAD.    Hospital Course     His symptom was concerning for unstable angina. He was subsequently [facility]  for further evaluation.  Fasting lipid panel obtained in the morning of 09/10/2017 showed total cholesterol 243, HDL 34, triglyceride 589, LDL was unable to be calculated. He eventually underwent cardiac catheterization on the same day by Dr. Excell Seltzerooper which showed diffuse nonobstructive coronary artery disease without any evidence of high-grade coronary artery stenosis. He had normal LVEDP during cardiac catheterization. His chest pain was felt to be noncardiac in nature, he is deemed stable for discharge from cardiology perspective once TR is removed and no bleeding issue occurs.  Given significantly elevated triglyceride, I will start him on lipitor 80 mg daily and lovaza 1000mg  BID. He will need FLP and LFT in 6-8 weeks. I will arrange 2-3 weeks outpatient visit. Once risk factors under control, he can be followed on a yearly basis. He will need to follow his PCP closely for management of diabetes mellitus.    _____________  Discharge Vitals Blood pressure 130/81, pulse 75, temperature 98.8 F (37.1 C), temperature source Axillary, resp. rate 13, height 5\' 10"  (1.778 m), weight 224 lb 3.2 oz (101.7 kg), SpO2 99 %.  Filed Weights   09/09/17 1800 09/10/17 0524  Weight: 226 lb 10.1 oz (102.8 kg) 224 lb 3.2 oz (101.7 kg)     Subjective: Denies any chest pain or SOB.   Physical exam: GEN:No acute distress.   Neck:No JVD Cardiac:RRR, no murmurs, rubs, or gallops. R wrist TR band stable. No bleeding Respiratory:Clear to auscultation anteriorly WU:JWJXGI:Soft, nontender, non-distended  MS:No edema; No deformity. Neuro:Nonfocal  Psych:  Normal affect     Labs & Radiologic Studies    CBC Recent Labs    09/10/17 0052  WBC 7.5  HGB 15.5  HCT 44.7  MCV 86.3  PLT 219   Basic Metabolic Panel Recent Labs    11/91/4711/23/18 0340  NA 136  K 3.7  CL 105  CO2 24  GLUCOSE 255*  BUN 12  CREATININE 0.82  CALCIUM 8.6*    Fasting Lipid Panel Recent Labs    09/10/17 0340  CHOL 243*  HDL 34*  LDLCALC UNABLE TO  CALCULATE IF TRIGLYCERIDE OVER 400 mg/dL  TRIG [id number]*  CHOLHDL 7.1   _____________  No results found. Disposition   Pt is being discharged home today in good condition.  Follow-up Plans & Appointments    Follow-up Information    Nodal, Joline SaltJr Reinaldo, PA-C. Schedule an appointment as soon as possible for a visit in 1 week(s).   Specialty:  Physician Assistant Why:  Will also need fasting lipid panel and liver function test in 6-8 weeks Contact information: [address] [place] [id number] [phone]        Tonny Bollmanooper, Michael, MD Follow up.   Specialty:  Cardiology Why:  our scheduler will contact you to arrange outpatient visit in 2-3 weeks Contact information: [address] [place] [address] [phone]            Discharge Medications   Current Discharge Medication List    START taking these medications   Details  aspirin EC 81 MG EC tablet Take 1 tablet (81 mg total) by mouth daily.    atorvastatin (LIPITOR) 80 MG tablet Take 1 tablet (80 mg total) by mouth daily at 6 PM. Qty: 90 tablet, Refills: 3    metoprolol tartrate (LOPRESSOR) 25 MG tablet Take 0.5 tablets (12.5 mg total) by mouth 2 (two) times daily. Qty: 30 tablet, Refills: 5    omega-3 acid ethyl esters (LOVAZA) 1 g capsule Take 1 capsule (1 g total) by mouth 2 (two) times daily. Qty: 60 capsule, Refills: 11      STOP taking these medications     hydrochlorothiazide (HYDRODIURIL) 25 MG tablet      metFORMIN (GLUCOPHAGE) 500 MG tablet      sitaGLIPtin-metformin (JANUMET) 50-1000 MG tablet          Aspirin prescribed at discharge?  Yes High Intensity Statin Prescribed? (Lipitor 40-80mg  or Crestor 20-40mg ): Yes Beta Blocker Prescribed? Yes For EF <40%, was ACEI/ARB Prescribed? No: EF > 40% ADP Receptor Inhibitor Prescribed? (i.e. Plavix etc.-Includes Medically Managed Patients): No: no intervention For EF <40%, Aldosterone Inhibitor Prescribed? No: EF >  40% Was EF assessed during THIS hospitalization? Yes Was Cardiac Rehab II ordered? (Included Medically managed Patients): No: no significant CAD   Outstanding Labs/Studies   Fasting lipid panel and Liver function test in 6-8 weeks  Duration of Discharge Encounter   Greater than 30 minutes including physician time.  SignedAzalee Course, Cortez Steelman NP 09/10/2017, 3:50 PM

## 2017-09-10 NOTE — Interval H&P Note (Signed)
Cath Lab Visit (complete for each Cath Lab visit)  Clinical Evaluation Leading to the Procedure:   ACS: Yes.    Non-ACS:    Anginal Classification: CCS IV  Anti-ischemic medical therapy: Minimal Therapy (1 class of medications)  Non-Invasive Test Results: No non-invasive testing performed  Prior CABG: No previous CABG  History and Physical Interval Note:  09/10/2017 12:15 PM  Lanora ManisGary Farooq  has presented today for surgery, with the diagnosis of unstable angina  The various methods of treatment have been discussed with the patient and family. After consideration of risks, benefits and other options for treatment, the patient has consented to  Procedure(s): LEFT HEART CATH AND CORONARY ANGIOGRAPHY (N/A) as a surgical intervention .  The patient's history has been reviewed, patient examined, no change in status, stable for surgery.  I have reviewed the patient's chart and labs.  Questions were answered to the patient's satisfaction.     Audelia HivesMichale Rebecca Cairns

## 2022-10-31 ENCOUNTER — Observation Stay (HOSPITAL_COMMUNITY)
Admission: EM | Admit: 2022-10-31 | Discharge: 2022-11-01 | Disposition: A | Discharge status: Home / Self Care | Payer: Medicaid Other | Attending: Internal Medicine | Admitting: Internal Medicine

## 2022-10-31 ENCOUNTER — Emergency Department (HOSPITAL_COMMUNITY): Payer: Medicaid Other

## 2022-10-31 ENCOUNTER — Other Ambulatory Visit: Payer: Self-pay

## 2022-10-31 ENCOUNTER — Encounter (HOSPITAL_COMMUNITY): Payer: Self-pay

## 2022-10-31 DIAGNOSIS — Z79899 Other long term (current) drug therapy: Secondary | ICD-10-CM | POA: Insufficient documentation

## 2022-10-31 DIAGNOSIS — R0609 Other forms of dyspnea: Secondary | ICD-10-CM | POA: Insufficient documentation

## 2022-10-31 DIAGNOSIS — I2089 Other forms of angina pectoris: Secondary | ICD-10-CM | POA: Diagnosis not present

## 2022-10-31 DIAGNOSIS — I10 Essential (primary) hypertension: Secondary | ICD-10-CM

## 2022-10-31 DIAGNOSIS — Z794 Long term (current) use of insulin: Secondary | ICD-10-CM

## 2022-10-31 DIAGNOSIS — F17210 Nicotine dependence, cigarettes, uncomplicated: Secondary | ICD-10-CM | POA: Insufficient documentation

## 2022-10-31 DIAGNOSIS — E119 Type 2 diabetes mellitus without complications: Secondary | ICD-10-CM | POA: Diagnosis not present

## 2022-10-31 DIAGNOSIS — Z7982 Long term (current) use of aspirin: Secondary | ICD-10-CM | POA: Insufficient documentation

## 2022-10-31 DIAGNOSIS — Z720 Tobacco use: Secondary | ICD-10-CM

## 2022-10-31 DIAGNOSIS — R079 Chest pain, unspecified: Principal | ICD-10-CM | POA: Insufficient documentation

## 2022-10-31 DIAGNOSIS — E785 Hyperlipidemia, unspecified: Secondary | ICD-10-CM

## 2022-10-31 DIAGNOSIS — I25118 Atherosclerotic heart disease of native coronary artery with other forms of angina pectoris: Secondary | ICD-10-CM | POA: Insufficient documentation

## 2022-10-31 LAB — BASIC METABOLIC PANEL
Anion gap: 9 (ref 5–15)
BUN: 12 mg/dL (ref 6–20)
CO2: 26 mmol/L (ref 22–32)
Calcium: 8.9 mg/dL (ref 8.9–10.3)
Chloride: 102 mmol/L (ref 98–111)
Creatinine, Ser: 0.88 mg/dL (ref 0.61–1.24)
GFR, Estimated: >60 mL/min (ref >60)
Glucose, Bld: 189 mg/dL — ABNORMAL HIGH (ref 70–99)
Potassium: 3.8 mmol/L (ref 3.5–5.1)
Sodium: 137 mmol/L (ref 135–145)

## 2022-10-31 LAB — TROPONIN I (HIGH SENSITIVITY)
Troponin I (High Sensitivity): 3 ng/L (ref <18)
Troponin I (High Sensitivity): 3 ng/L (ref <18)

## 2022-10-31 LAB — CBC
HCT: 44.9 % (ref 39.0–52.0)
Hemoglobin: 15.4 g/dL (ref 13.0–17.0)
MCH: 29.4 pg (ref 26.0–34.0)
MCHC: 34.3 g/dL (ref 30.0–36.0)
MCV: 85.7 fL (ref 80.0–100.0)
Platelets: 295 10*3/uL (ref 150–400)
RBC: 5.24 MIL/uL (ref 4.22–5.81)
RDW: 12.9 % (ref 11.5–15.5)
WBC: 7.7 10*3/uL (ref 4.0–10.5)
nRBC: 0 % (ref 0.0–0.2)

## 2022-10-31 LAB — CBG MONITORING, ED: Glucose-Capillary: 239 mg/dL — ABNORMAL HIGH (ref 70–99)

## 2022-10-31 LAB — BRAIN NATRIURETIC PEPTIDE: B Natriuretic Peptide: 8.6 pg/mL (ref 0.0–100.0)

## 2022-10-31 LAB — TSH: TSH: 0.948 u[IU]/mL (ref 0.350–4.500)

## 2022-10-31 LAB — T4, FREE: Free T4: 0.89 ng/dL (ref 0.61–1.12)

## 2022-10-31 MED ORDER — NITROGLYCERIN 0.2 MG/HR TD PT24
0.2000 mg | MEDICATED_PATCH | Freq: Every day | TRANSDERMAL | Status: DC
  Administered 2022-11-01: 0.2 mg via TRANSDERMAL
  Filled 2022-10-31: qty 1

## 2022-10-31 MED ORDER — INSULIN ASPART 100 UNIT/ML IJ SOLN
0.0000 [IU] | Freq: Three times a day (TID) | INTRAMUSCULAR | Status: DC
  Administered 2022-11-01: 11 [IU] via SUBCUTANEOUS
  Administered 2022-11-01: 5 [IU] via SUBCUTANEOUS

## 2022-10-31 MED ORDER — METOPROLOL TARTRATE 25 MG PO TABS: 12.5000 mg | ORAL_TABLET | Freq: Two times a day (BID) | ORAL | Status: DC

## 2022-10-31 MED ORDER — INSULIN ASPART 100 UNIT/ML IJ SOLN
0.0000 [IU] | Freq: Every day | INTRAMUSCULAR | Status: DC
  Administered 2022-11-01: 2 [IU] via SUBCUTANEOUS

## 2022-10-31 MED ORDER — ENOXAPARIN SODIUM 40 MG/0.4ML IJ SOSY
40.0000 mg | PREFILLED_SYRINGE | INTRAMUSCULAR | Status: DC
  Administered 2022-11-01: 40 mg via SUBCUTANEOUS
  Filled 2022-10-31: qty 0.4

## 2022-10-31 MED ORDER — ATORVASTATIN CALCIUM 10 MG PO TABS: 20.0000 mg | ORAL_TABLET | Freq: Every day | ORAL | Status: DC

## 2022-10-31 MED ORDER — NITROGLYCERIN 0.4 MG SL SUBL: 0.4000 mg | SUBLINGUAL_TABLET | SUBLINGUAL | Status: DC | PRN

## 2022-10-31 MED ORDER — ALUM & MAG HYDROXIDE-SIMETH 200-200-20 MG/5ML PO SUSP
30.0000 mL | Freq: Once | ORAL | Status: AC
  Administered 2022-11-01: 30 mL via ORAL
  Filled 2022-10-31: qty 30

## 2022-10-31 MED ORDER — NICOTINE 14 MG/24HR TD PT24
14.0000 mg | MEDICATED_PATCH | Freq: Every day | TRANSDERMAL | Status: DC
  Administered 2022-11-01: 14 mg via TRANSDERMAL
  Filled 2022-10-31: qty 1

## 2022-10-31 MED ORDER — ACETAMINOPHEN 325 MG PO TABS: 650.0000 mg | ORAL_TABLET | ORAL | Status: DC | PRN

## 2022-10-31 MED ORDER — ONDANSETRON HCL 4 MG/2ML IJ SOLN: 4.0000 mg | Freq: Four times a day (QID) | INTRAMUSCULAR | Status: DC | PRN

## 2022-10-31 NOTE — ED Provider Triage Note (Signed)
Emergency Medicine Provider Triage Evaluation Note  Shane Merritt , a 57 y.o. male  was evaluated in triage.  Pt complains of chest pain and SOB over the past few months. He reports that his diabetes specialist found that he has an "irrgeular heartbeat". He is supposed to follow up with a cardiologist in a few months. He reports that he can feel his flutter and irregularity. Denies any syncope. Reports occasional SOB. Denies any abdominal pain or vomiting. Reports occasional nausea.   Review of Systems  Positive:  Negative:   Physical Exam  BP (!) 139/98 (BP Location: Right Arm)   Pulse 90   Temp 97.7 F (36.5 C)   Resp 18   Ht 5\' 10"  (1.778 m)   Wt 101.7 kg   SpO2 100%   BMI 32.17 kg/m  Gen:   Awake, no distress   Resp:  Normal effort  MSK:   Moves extremities without difficulty  Other:  Occasional PVC. Lungs are CTAB  Medical Decision Making  Medically screening exam initiated at 7:36 PM.  Appropriate orders placed.  Shane Merritt was informed that the remainder of the evaluation will be completed by another provider, this initial triage assessment does not replace that evaluation, and the importance of remaining in the ED until their evaluation is complete.  Labs ordered. The patient is in no acute distress   Shane Merritt, [place] 10/31/22 1945

## 2022-10-31 NOTE — H&P (Incomplete)
PCP:   Maggie Schwalbe, PA-C   Chief Complaint:  Chest pain  HPI: This is a 57 year old male with cardiac risk factors of hypertension, dyslipidemia, diabetes mellitus and tobacco use.  He presents with complaints of mid chest hurting and getting very little winded.  This occurred while he was out hunting, per patient he was walking a lot.  The pain lasted approximately 30 minutes, slowly dissipating.  He endorses palpitation, nausea, diaphoresis.  He denies emesis.  Per patient he was diagnosed with a regular heart rate a few weeks ago at his endocrinologist office.  An appointment was made for him to see Dr. Otho Perl.  The appointment remains in the future. The patient is diabetic which is poorly controlled.  Fingerstick blood sugars typically in the 300s.  Arrangements being made for him to have a pump.  His blood pressure typically is good.  In the ER patient's troponins have been flat at 3 and 3.  EKG without any acute ischemic changes.  Positive PVCs.  Review of Systems:  The patient denies anorexia, fever, weight loss,, vision loss, decreased hearing, hoarseness, chest pain, syncope, dyspnea on exertion, peripheral edema, balance deficits, hemoptysis, abdominal pain, melena, hematochezia, severe indigestion/heartburn, hematuria, incontinence, genital sores, muscle weakness, suspicious skin lesions, transient blindness, difficulty walking, depression, unusual weight change, abnormal bleeding, enlarged lymph nodes, angioedema, and breast masses. Positives: Chest pain, shortness of breath, nausea, palpitations  Past Medical History: Past Medical History:  Diagnosis Date   Anginal pain ([place])    GERD (gastroesophageal reflux disease)    Hypercholesterolemia    /notes 10/18/2011   Hypertension    Type II diabetes mellitus ([place])    "started RX last week" (09/09/2017)   Past Surgical History:  Procedure Laterality Date   LEFT HEART CATH AND CORONARY ANGIOGRAPHY N/A 09/10/2017   Procedure:  LEFT HEART CATH AND CORONARY ANGIOGRAPHY;  Surgeon: Sherren Mocha, MD;  Location: [facility];  Service: Cardiovascular;  Laterality: N/A;   NO PAST SURGERIES      Medications: Prior to Admission medications   Medication Sig Start Date End Date Taking? Authorizing Provider  aspirin EC 81 MG EC tablet Take 1 tablet (81 mg total) by mouth daily. 09/11/17   Almyra Deforest, PA  atorvastatin (LIPITOR) 80 MG tablet Take 1 tablet (80 mg total) by mouth daily at 6 PM. 09/10/17   Almyra Deforest, PA  metoprolol tartrate (LOPRESSOR) 25 MG tablet Take 0.5 tablets (12.5 mg total) by mouth 2 (two) times daily. 09/10/17   Almyra Deforest, PA  omega-3 acid ethyl esters (LOVAZA) 1 g capsule Take 1 capsule (1 g total) by mouth 2 (two) times daily. 09/10/17   Almyra Deforest, PA    Allergies:  No Known Allergies  Social History:  reports that he has been smoking cigarettes. He has been smoking an average of 1 pack per day. He has never used smokeless tobacco. He reports that he does not drink alcohol and does not use drugs.  Family History: Family History  Problem Relation Age of Onset   Hypertension Father     Physical Exam: Vitals:   10/31/22 1843 10/31/22 2145 10/31/22 2200 10/31/22 2300  BP: (!) 139/98 114/80 (!) 118/90   Pulse: 90 75 71   Resp: 18 (!) 22 17   Temp: 97.7 F (36.5 C)   97.6 F (36.4 C)  TempSrc:    Oral  SpO2: 100% 97% 96%   Weight:      Height:  General:  Alert and oriented times three, well developed and nourished, no acute distress Eyes: PERRLA, pink conjunctiva, no scleral icterus ENT: Moist oral mucosa, neck supple, no thyromegaly Lungs: clear to ascultation, no wheeze, no crackles, no use of accessory muscles Cardiovascular: regular rate and rhythm, no regurgitation, no gallops, no murmurs. No carotid bruits, no JVD Abdomen: soft, positive BS, non-tender, non-distended, no organomegaly, not an acute abdomen GU: not examined Neuro: CN II - XII grossly intact, sensation  intact Musculoskeletal: strength 5/5 all extremities, no clubbing, cyanosis or edema Skin: no rash, no subcutaneous crepitation, no decubitus Psych: appropriate patient   Labs on Admission:  Recent Labs    10/31/22 1846  NA 137  K 3.8  CL 102  CO2 26  GLUCOSE 189*  BUN 12  CREATININE 0.88  CALCIUM 8.9    Recent Labs    10/31/22 1846  WBC 7.7  HGB 15.4  HCT 44.9  MCV 85.7  PLT 295    Recent Labs    10/31/22 1846  TSH 0.948    Radiological Exams on Admission: DG Chest 2 View  Result Date: 10/31/2022 CLINICAL DATA:  Chest pain EXAM: CHEST - 2 VIEW COMPARISON:  Chest radiograph 09/08/2017 FINDINGS: The cardiomediastinal contours are within normal limits. The lungs are clear. No pneumothorax or pleural effusion. No acute finding in the visualized skeleton. IMPRESSION: No active cardiopulmonary disease. Electronically Signed   By: Audie Pinto M.D.   On: 10/31/2022 19:31    Assessment/Plan Present on Admission:  Chest pain -Admit to med telemetry -Chest pain order set initiated -Lipid panel ordered -Aspirin daily, Lipitor, metoprolol resumed.  Lipitor increased to 80 mg daily. -Cardiology consult placed, with Gay Filler   Hypertension -Metoprolol resumed   Hyperlipidemia -Atorvastatin resumed  DM type 2 -Lantus resumed.  With sliding scale insulin.  Tobacco use -Nicotine patch ordered  Mahima Hottle 10/31/2022, 11:03 PM

## 2022-10-31 NOTE — ED Triage Notes (Addendum)
Pt c/o non radiating left sided chest pain and SOB w/exertion started today. Left arm heavinessx2-3d. Pt states went to PCP and was told by them that his heart beat was irregular.

## 2022-10-31 NOTE — ED Provider Notes (Signed)
[facility] EMERGENCY DEPARTMENT Provider Note   CSN: [id number] Arrival date & time: 10/31/22  1834     History {Add pertinent medical, surgical, social history, OB history to HPI:1} Chief Complaint  Patient presents with   Chest Pain    Lenzy Kerschner is a 57 y.o. male.  Past medical history of hypertension, hyperlipidemia, type 2 diabetes, GERD, tobacco use, and coronary artery disease without history of stenting or CABG with left heart cath in 2018.  Presenting to the emergency department today for right-sided chest pain and dyspnea with exertion which started today.  Additionally has had left arm heaviness for the past 2 to 3 days.  Initially went to his primary doctor and was told that he had an irregular heartbeat that he should go to the emergency department. ***   Chest Pain      Home Medications Prior to Admission medications   Medication Sig Start Date End Date Taking? Authorizing Provider  aspirin EC 81 MG EC tablet Take 1 tablet (81 mg total) by mouth daily. 09/11/17   Almyra Deforest, PA  atorvastatin (LIPITOR) 80 MG tablet Take 1 tablet (80 mg total) by mouth daily at 6 PM. 09/10/17   Almyra Deforest, PA  metoprolol tartrate (LOPRESSOR) 25 MG tablet Take 0.5 tablets (12.5 mg total) by mouth 2 (two) times daily. 09/10/17   Almyra Deforest, PA  omega-3 acid ethyl esters (LOVAZA) 1 g capsule Take 1 capsule (1 g total) by mouth 2 (two) times daily. 09/10/17   Almyra Deforest, [place]      Allergies    Patient has no known allergies.    Review of Systems   Review of Systems  Cardiovascular:  Positive for chest pain.    Physical Exam Updated Vital Signs BP (!) 139/98 (BP Location: Right Arm)   Pulse 90   Temp 97.7 F (36.5 C)   Resp 18   Ht 5\' 10"  (1.778 m)   Wt 101.7 kg   SpO2 100%   BMI 32.17 kg/m  Physical Exam  ED Results / Procedures / Treatments   Labs (all labs ordered are listed, but only abnormal results are displayed) Labs Reviewed  CBC  BASIC METABOLIC  PANEL  T4, FREE  BRAIN NATRIURETIC PEPTIDE  TSH  TROPONIN I (HIGH SENSITIVITY)  TROPONIN I (HIGH SENSITIVITY)    EKG EKG Interpretation  Date/Time:  Saturday October 31 2022 18:42:19 EST Ventricular Rate:  88 PR Interval:  170 QRS Duration: 106 QT Interval:  366 QTC Calculation: 442 R Axis:   50 Text Interpretation: Sinus rhythm with frequent Premature ventricular complexes Inferior infarct , age undetermined Abnormal ECG When compared with ECG of 10-Sep-2017 05:18, PREVIOUS ECG IS PRESENT Confirmed by Nanda Quinton [id number]) on 10/31/2022 9:00:03 PM  Radiology DG Chest 2 View  Result Date: 10/31/2022 CLINICAL DATA:  Chest pain EXAM: CHEST - 2 VIEW COMPARISON:  Chest radiograph 09/08/2017 FINDINGS: The cardiomediastinal contours are within normal limits. The lungs are clear. No pneumothorax or pleural effusion. No acute finding in the visualized skeleton. IMPRESSION: No active cardiopulmonary disease. Electronically Signed   By: Audie Pinto M.D.   On: 10/31/2022 19:31    Procedures Procedures  {Document cardiac monitor, telemetry assessment procedure when appropriate:1}  Medications Ordered in ED Medications - No data to display  ED Course/ Medical Decision Making/ A&P   {   Click here for ABCD2, HEART and other calculatorsREFRESH Note before signing :1}  Medical Decision Making Amount and/or Complexity of Data Reviewed Labs: ordered. Radiology: ordered.   Pt is a 57 y.o. male with *** pertinent PMHX *** who presents w/ ***  Likely ***.   *** Because of the age and risk factors of the patient, ACS will be ruled out with troponin x ***. ASA was *** given. Nitro was *** given ***. Chest XR and EKG performed. EKG: {ekg findings:315101::"normal EKG, normal sinus rhythm","unchanged from previous tracings"}. EKG reviewed by myself and the attending.   HEAR score calculation: ***   Unlikely PNA as CXR ***, *** leukocytosis, *** cough, *** fever.  Unlikely PE as atypical presentation, *** risk per [facility], Doubt Aortic Dissection, Pancreatitis, Arrhythmia, Pneumothorax, Endo/Myo/Pericarditis, Shingles, Emergent complications of an Ulcer, Esophageal pathology, or other emergent pathology.    The plan for this patient was discussed with Dr. ***, who voiced agreement and who oversaw evaluation and treatment of this patient.     {Document critical care time when appropriate:1} {Document review of labs and clinical decision tools ie heart score, Chads2Vasc2 etc:1}  {Document your independent review of radiology images, and any outside records:1} {Document your discussion with family members, caretakers, and with consultants:1} {Document social determinants of health affecting pt's care:1} {Document your decision making why or why not admission, treatments were needed:1} Final Clinical Impression(s) / ED Diagnoses Final diagnoses:  None    Rx / DC Orders ED Discharge Orders     None

## 2022-11-01 ENCOUNTER — Other Ambulatory Visit: Payer: Self-pay | Admitting: Home Health

## 2022-11-01 DIAGNOSIS — R0602 Shortness of breath: Secondary | ICD-10-CM

## 2022-11-01 DIAGNOSIS — R072 Precordial pain: Secondary | ICD-10-CM

## 2022-11-01 DIAGNOSIS — R079 Chest pain, unspecified: Secondary | ICD-10-CM

## 2022-11-01 LAB — LIPID PANEL
Cholesterol: 131 mg/dL (ref 0–200)
HDL: 37 mg/dL — ABNORMAL LOW (ref >40)
LDL Cholesterol: 69 mg/dL (ref 0–99)
Total CHOL/HDL Ratio: 3.5 RATIO
Triglycerides: 123 mg/dL (ref <150)
VLDL: 25 mg/dL (ref 0–40)

## 2022-11-01 LAB — CBC
HCT: 43.3 % (ref 39.0–52.0)
Hemoglobin: 15 g/dL (ref 13.0–17.0)
MCH: 29.1 pg (ref 26.0–34.0)
MCHC: 34.6 g/dL (ref 30.0–36.0)
MCV: 84.1 fL (ref 80.0–100.0)
Platelets: 267 10*3/uL (ref 150–400)
RBC: 5.15 MIL/uL (ref 4.22–5.81)
RDW: 13 % (ref 11.5–15.5)
WBC: 7 10*3/uL (ref 4.0–10.5)
nRBC: 0 % (ref 0.0–0.2)

## 2022-11-01 LAB — CREATININE, SERUM
Creatinine, Ser: 0.77 mg/dL (ref 0.61–1.24)
GFR, Estimated: >60 mL/min (ref >60)

## 2022-11-01 LAB — CBG MONITORING, ED
Glucose-Capillary: 317 mg/dL — ABNORMAL HIGH (ref 70–99)
Glucose-Capillary: 240 mg/dL — ABNORMAL HIGH (ref 70–99)

## 2022-11-01 LAB — MAGNESIUM: Magnesium: 2 mg/dL (ref 1.7–2.4)

## 2022-11-01 LAB — PHOSPHORUS: Phosphorus: 4 mg/dL (ref 2.5–4.6)

## 2022-11-01 LAB — HEMOGLOBIN A1C
Hgb A1c MFr Bld: 10.1 % — ABNORMAL HIGH (ref 4.8–5.6)
Mean Plasma Glucose: 243.17 mg/dL

## 2022-11-01 LAB — HIV ANTIBODY (ROUTINE TESTING W REFLEX): HIV Screen 4th Generation wRfx: NONREACTIVE

## 2022-11-01 MED ORDER — ASPIRIN 81 MG PO TBEC: 81.0000 mg | DELAYED_RELEASE_TABLET | Freq: Every day | ORAL | Status: DC

## 2022-11-01 MED ORDER — INSULIN GLARGINE-YFGN 100 UNIT/ML ~~LOC~~ SOLN
30.0000 [IU] | Freq: Every day | SUBCUTANEOUS | Status: DC
  Filled 2022-11-01: qty 0.3

## 2022-11-01 MED ORDER — ATORVASTATIN CALCIUM 80 MG PO TABS: 80.0000 mg | ORAL_TABLET | Freq: Every day | ORAL | Status: DC

## 2022-11-01 MED ORDER — ASPIRIN 325 MG PO TABS
325.0000 mg | ORAL_TABLET | Freq: Every day | ORAL | Status: DC
  Administered 2022-11-01: 325 mg via ORAL
  Filled 2022-11-01: qty 1

## 2022-11-01 MED ORDER — NICOTINE 14 MG/24HR TD PT24: 14.0000 mg | MEDICATED_PATCH | Freq: Every day | TRANSDERMAL | 0 refills | Status: DC

## 2022-11-01 NOTE — Discharge Summary (Signed)
Physician Discharge Summary  Shane Merritt CZY:[id number] DOB: 02-04-1966 DOA: 10/31/2022  PCP: Maggie Schwalbe, PA-C  Admit date: 10/31/2022 Discharge date: 11/01/2022  Admitted From: e(Home) Disposition:  (Home )  Recommendations for Outpatient Follow-up:  Follow up with PCP in 1 weeks Patient to follow-up with cardiology as an outpatient regarding outpatient echocardiogram and CT coronary angio   Discharge Condition: (Stable) Diet recommendation: Heart Healthy / Carb Modified   Brief/Interim Summary:  This is a 57 year old male with cardiac risk factors of hypertension, dyslipidemia, diabetes mellitus and tobacco use.  He presents with complaints of mid chest hurting and getting very little winded.  This occurred while he was out hunting, per patient he was walking a lot.  The pain lasted approximately 30 minutes, slowly dissipating.  He endorses palpitation, nausea, diaphoresis.  He denies emesis.  Per patient he was diagnosed with a regular heart rate a few weeks ago at his endocrinologist office.  An appointment was made for him to see Dr. Otho Perl.  The appointment remains in the future. The patient is diabetic which is poorly controlled.  Fingerstick blood sugars typically in the 300s.  Arrangements being made for him to have a pump.  His blood pressure typically is good.   In the ER patient's troponins have been flat at 3 and 3.  EKG without any acute ischemic changes.  Positive PVCs. -Patient was admitted for further workup, where he was seen and evaluated by cardiology, please see discussion below    Chest pain -Cardiology input greatly appreciated, EKG reassuring, he had negative troponins, cardiac cath in 2018 showing mild diffuse next obstructive coronary artery disease, but he is having multiple risk factors including uncontrolled diabetes, so recommendation for outpatient ischemia evaluation, which include coronary CT angiogram which will be arranged as an outpatient, as were  recommendation for 2D echo, which can be done as an outpatient as discussed with healthy , to continue his home medication which includes aspirin, statin and beta-blockers  -Follow-up appointment has been scheduled for 1/29    Hypertension -Metoprolol resumed    Hyperlipidemia -Atorvastatin resumed   DM type 2 -Lantus resumed.  Continue home medication include Mounjaro and metformin   Tobacco use -Nicotine patch ordered   Discharge Diagnoses:  Principal Problem:   Chest pain Active Problems:   Hypertension   Hyperlipidemia   DM II (diabetes mellitus, type II), controlled ([place])   Tobacco use    Discharge Instructions  Discharge Instructions     Diet - low sodium heart healthy   Complete by: As directed    Diet Carb Modified   Complete by: As directed    Discharge instructions   Complete by: As directed    Follow with Primary MD Nodal, Alphonzo Dublin, PA-C in 7 days   Get CBC, CMP,  checked  by Primary MD next visit.    Activity: As tolerated with Full fall precautions use walker/cane & assistance as needed   Disposition Home    Diet: Heart Healthy / CARB MODIFIED   On your next visit with your primary care physician please Get Medicines reviewed and adjusted.   Please request your Prim.MD to go over all Hospital Tests and Procedure/Radiological results at the follow up, please get all Hospital records sent to your Prim MD by signing hospital release before you go home.   If you experience worsening of your admission symptoms, develop shortness of breath, life threatening emergency, suicidal or homicidal thoughts you must seek medical attention immediately by calling  911 or calling your MD immediately  if symptoms less severe.  You Must read complete instructions/literature along with all the possible adverse reactions/side effects for all the Medicines you take and that have been prescribed to you. Take any new Medicines after you have completely understood and  accpet all the possible adverse reactions/side effects.   Do not drive, operating heavy machinery, perform activities at heights, swimming or participation in water activities or provide baby sitting services if your were admitted for syncope or siezures until you have seen by Primary MD or a Neurologist and advised to do so again.  Do not drive when taking Pain medications.    Do not take more than prescribed Pain, Sleep and Anxiety Medications  Special Instructions: If you have smoked or chewed Tobacco  in the last 2 yrs please stop smoking, stop any regular Alcohol  and or any Recreational drug use.  Wear Seat belts while driving.   Please note  You were cared for by a hospitalist during your hospital stay. If you have any questions about your discharge medications or the care you received while you were in the hospital after you are discharged, you can call the unit and asked to speak with the hospitalist on call if the hospitalist that took care of you is not available. Once you are discharged, your primary care physician will handle any further medical issues. Please note that NO REFILLS for any discharge medications will be authorized once you are discharged, as it is imperative that you return to your primary care physician (or establish a relationship with a primary care physician if you do not have one) for your aftercare needs so that they can reassess your need for medications and monitor your lab values.   Increase activity slowly   Complete by: As directed       Allergies as of 11/01/2022   No Known Allergies      Medication List     STOP taking these medications    ibuprofen 800 MG tablet Commonly known as: ADVIL       TAKE these medications    aspirin EC 81 MG tablet Take 1 tablet (81 mg total) by mouth daily.   atorvastatin 20 MG tablet Commonly known as: LIPITOR Take 20 mg by mouth daily. What changed: Another medication with the same name was removed.  Continue taking this medication, and follow the directions you see here.   Insulin Glargine Solostar 100 UNIT/ML Solostar Pen Commonly known as: LANTUS Inject 32 Units into the skin at bedtime.   insulin lispro 100 UNIT/ML KwikPen Commonly known as: HUMALOG Inject 12 Units into the skin 3 (three) times daily before meals.   lisinopril 20 MG tablet Commonly known as: ZESTRIL Take 20 mg by mouth daily.   metFORMIN 1000 MG tablet Commonly known as: GLUCOPHAGE Take 1,000 mg by mouth 2 (two) times daily with a meal.   metoprolol tartrate 25 MG tablet Commonly known as: LOPRESSOR Take 0.5 tablets (12.5 mg total) by mouth 2 (two) times daily.   Mounjaro 5 MG/0.5ML Pen Generic drug: tirzepatide Inject 5 mg into the skin once a week.   nicotine 14 mg/24hr patch Commonly known as: NICODERM CQ - dosed in mg/24 hours Place 1 patch (14 mg total) onto the skin daily. Start taking on: November 02, 2022   omega-3 acid ethyl esters 1 g capsule Commonly known as: LOVAZA Take 1 capsule (1 g total) by mouth 2 (two) times daily.  Follow-up Information     Corrin Parker, PA-C Follow up on 11/16/2022.   Specialty: Cardiology Why: at 8:25am for cardiology appointment Contact information: [address] [place] [address] [phone]                No Known Allergies  Consultations: Cardiology   Procedures/Studies: DG Chest 2 View  Result Date: 10/31/2022 CLINICAL DATA:  Chest pain EXAM: CHEST - 2 VIEW COMPARISON:  Chest radiograph 09/08/2017 FINDINGS: The cardiomediastinal contours are within normal limits. The lungs are clear. No pneumothorax or pleural effusion. No acute finding in the visualized skeleton. IMPRESSION: No active cardiopulmonary disease. Electronically Signed   By: Emmaline Kluver M.D.   On: 10/31/2022 19:31    Subjective: Currently denies any chest pain or shortness of breath  Discharge Exam: Vitals:   11/01/22 1429 11/01/22  1600  BP:  122/88  Pulse:  96  Resp:  18  Temp: (!) 97.5 F (36.4 C)   SpO2:  97%   Vitals:   11/01/22 1300 11/01/22 1400 11/01/22 1429 11/01/22 1600  BP: 124/88 120/88  122/88  Pulse: 92 87  96  Resp:    18  Temp:   (!) 97.5 F (36.4 C)   TempSrc:   Oral   SpO2: 98% 95%  97%  Weight:      Height:        General: Pt is alert, awake, not in acute distress Cardiovascular: RRR, S1/S2 +, no rubs, no gallops Respiratory: CTA bilaterally, no wheezing, no rhonchi Abdominal: Soft, NT, ND, bowel sounds + Extremities: no edema, no cyanosis    The results of significant diagnostics from this hospitalization (including imaging, microbiology, ancillary and laboratory) are listed below for reference.     Microbiology: No results found for this or any previous visit (from the past 240 hour(s)).   Labs: BNP (last 3 results) Recent Labs    10/31/22 1944  BNP 8.6   Basic Metabolic Panel: Recent Labs  Lab 10/31/22 1846 10/31/22 2334  NA 137  --   K 3.8  --   CL 102  --   CO2 26  --   GLUCOSE 189*  --   BUN 12  --   CREATININE 0.88 0.77  CALCIUM 8.9  --   MG  --  2.0  PHOS  --  4.0   Liver Function Tests: No results for input(s): "AST", "ALT", "ALKPHOS", "BILITOT", "PROT", "ALBUMIN" in the last 168 hours. No results for input(s): "LIPASE", "AMYLASE" in the last 168 hours. No results for input(s): "AMMONIA" in the last 168 hours. CBC: Recent Labs  Lab 10/31/22 1846 10/31/22 2334  WBC 7.7 7.0  HGB 15.4 15.0  HCT 44.9 43.3  MCV 85.7 84.1  PLT 295 267   Cardiac Enzymes: No results for input(s): "CKTOTAL", "CKMB", "CKMBINDEX", "TROPONINI" in the last 168 hours. BNP: Invalid input(s): "POCBNP" CBG: Recent Labs  Lab 10/31/22 2342 11/01/22 0830 11/01/22 1152  GLUCAP 239* 317* 240*   D-Dimer No results for input(s): "DDIMER" in the last 72 hours. Hgb A1c Recent Labs    10/31/22 2344  HGBA1C 10.1*   Lipid Profile Recent Labs    10/31/22 2334  CHOL  131  HDL 37*  LDLCALC 69  TRIG 211  CHOLHDL 3.5   Thyroid function studies Recent Labs    10/31/22 1846  TSH 0.948   Anemia work up No results for input(s): "VITAMINB12", "FOLATE", "FERRITIN", "TIBC", "IRON", "RETICCTPCT" in the last 72 hours.  Urinalysis No results found for: "COLORURINE", "APPEARANCEUR", "LABSPEC", "PHURINE", "GLUCOSEU", "HGBUR", "BILIRUBINUR", "KETONESUR", "PROTEINUR", "UROBILINOGEN", "NITRITE", "LEUKOCYTESUR" Sepsis Labs Recent Labs  Lab 10/31/22 1846 10/31/22 2334  WBC 7.7 7.0   Microbiology No results found for this or any previous visit (from the past 240 hour(s)).   Time coordinating discharge: Over 30 minutes  SIGNED:   Huey Bienenstock, MD  [facility] 11/01/2022, 4:19 PM Pager   If 7PM-7AM, please contact night-coverage [email or web address]

## 2022-11-01 NOTE — Discharge Instructions (Addendum)
   Your cardiac CT will be scheduled at one of the below locations:   [facility] [address] [place], [place] [address] (336) [place] [address] [place], [place] [address] [phone]  [facility] [place], [place] [address] [phone]  If scheduled at [facility], please arrive at the [facility] and Children's Entrance (Entrance C2) of [facility] 30 minutes prior to test start time. You can use the FREE valet parking offered at entrance C (encouraged to control the heart rate for the test)  Proceed to the [facility] Radiology Department (first floor) to check-in and test prep.  All radiology patients and guests should use entrance C2 at [facility], accessed from [facility], even though the hospital's physical address listed is [address].    If scheduled at [facility] or [facility], please arrive 15 mins early for check-in and test prep.   Please follow these instructions carefully (unless otherwise directed):  Hold all erectile dysfunction medications at least 3 days (72 hrs) prior to test. (Ie viagra, cialis, sildenafil, tadalafil, etc) We will administer nitroglycerin during this exam.   On the Night Before the Test: Be sure to Drink plenty of water. Do not consume any caffeinated/decaffeinated beverages or chocolate 12 hours prior to your test. Do not take any antihistamines 12 hours prior to your test.  On the Day of the Test: Drink plenty of water until 1 hour prior to the test. Do not eat any food 1 hour prior to test. You may take your regular medications prior to the test.  Take metoprolol (Lopressor) two hours prior to test.  After the Test: Drink plenty of water. After receiving IV contrast, you may experience a mild flushed  feeling. This is normal. On occasion, you may experience a mild rash up to 24 hours after the test. This is not dangerous. If this occurs, you can take Benadryl 25 mg and increase your fluid intake. If you experience trouble breathing, this can be serious. If it is severe call 911 IMMEDIATELY. If it is mild, please call our office. If you take any of these medications: Glipizide/Metformin, Avandament, Glucavance, please do not take 48 hours after completing test unless otherwise instructed.  We will call to schedule your test 2-4 weeks out understanding that some insurance companies will need an authorization prior to the service being performed.   For non-scheduling related questions, please contact the cardiac imaging nurse navigator should you have any questions/concerns: Marchia Bond, Cardiac Imaging Nurse Navigator Gordy Clement, Cardiac Imaging Nurse Navigator [facility] Direct Office Dial: [phone]   For scheduling needs, including cancellations and rescheduling, please call [place], [phone].

## 2022-11-01 NOTE — Progress Notes (Signed)
Outpatient CT coronary study ordered per Dr Debara Pickett request. Message sent to Marchia Bond for arranging the test for the patient. Informed the patient and his wife to anticipate call from staff.

## 2022-11-01 NOTE — Consult Note (Signed)
Cardiology Consultation   Patient ID: Shane Merritt MRN: [id number]; DOB: May 12, 1966  Admit date: 10/31/2022 Date of Consult: 11/01/2022  PCP:  Leane Call, PA-C   [facility] Providers Cardiologist:  None        Patient Profile:   Shane Merritt is a 57 y.o. male with a history of chest pain with mild non-obstructive disease noted on cardiac catheterization in 2018, hypertension, hyperlipidemia, type 2 diabetes mellitus, and GERD who is being seen 11/01/2022 for the evaluation of chest pain at the request of Dr. Joneen Roach.  History of Present Illness:   Shane Merritt is a 57 year old male with the above history. He was admitted in 08/2017 for chest pain. Cardiac catheterization at that time showed diffuse mild non-obstructive disease but no evidence of high grade coronary artery stenosis. Chest pain was felt to be non-cardiac in nature. He was started on Lipitor and Lovaza for significantly elevated Triglycerides. Patient has not been seen by Cardiology seen that time.  He presented to the [facility] ED on 10/31/2022 for further evaluation of chest pain and shortness of breath, symptoms which were worst while walking or hunting.  He said the chest pain lasted about 30 minutes and slowly dissipated.  He was told that he had an irregular heart rhythm and his endocrinologist office recently however EKG here showed normal sinus rhythm, rate 88 bpm, with multiple PVCs and some Q waves in inferior leads but no acute ST/ T changes.  No evidence of A-fib.  High-sensitivity troponin negative x2 (3 >> 3). BNP normal. Chest x-ray showed no acute findings. WBC 7.7, Hgb 15.4, Plts 295. Na 137, K 3.8, Glucose 189, BUN 12, Cr 0.88. TSH and free T4 normal. Patient was admitted and Cardiology consulted for further evaluation.   Past Medical History:  Diagnosis Date   Anginal pain (HCC)    GERD (gastroesophageal reflux disease)    Hypercholesterolemia    /notes 10/18/2011   Hypertension    Type  II diabetes mellitus (HCC)    "started RX last week" (09/09/2017)    Past Surgical History:  Procedure Laterality Date   LEFT HEART CATH AND CORONARY ANGIOGRAPHY N/A 09/10/2017   Procedure: LEFT HEART CATH AND CORONARY ANGIOGRAPHY;  Surgeon: Tonny Bollman, MD;  Location: [facility] INVASIVE CV LAB;  Service: Cardiovascular;  Laterality: N/A;   NO PAST SURGERIES       Home Medications:  Prior to Admission medications   Medication Sig Start Date End Date Taking? Authorizing Provider  aspirin EC 81 MG EC tablet Take 1 tablet (81 mg total) by mouth daily. 09/11/17   Azalee Course, PA  atorvastatin (LIPITOR) 80 MG tablet Take 1 tablet (80 mg total) by mouth daily at 6 PM. 09/10/17   Azalee Course, PA  metoprolol tartrate (LOPRESSOR) 25 MG tablet Take 0.5 tablets (12.5 mg total) by mouth 2 (two) times daily. 09/10/17   Azalee Course, PA  omega-3 acid ethyl esters (LOVAZA) 1 g capsule Take 1 capsule (1 g total) by mouth 2 (two) times daily. 09/10/17   Azalee Course, PA    Inpatient Medications: Scheduled Meds:  aspirin  325 mg Oral Daily   atorvastatin  80 mg Oral q1800   enoxaparin (LOVENOX) injection  40 mg Subcutaneous Q24H   insulin aspart  0-15 Units Subcutaneous TID WC   insulin aspart  0-5 Units Subcutaneous QHS   insulin glargine-yfgn  30 Units Subcutaneous QHS   metoprolol tartrate  12.5 mg Oral BID   nicotine  14 mg Transdermal Daily   nitroGLYCERIN  0.2 mg Transdermal Daily   Continuous Infusions:  PRN Meds: acetaminophen, nitroGLYCERIN, ondansetron (ZOFRAN) IV  Allergies:   No Known Allergies  Social History:   Social History   Socioeconomic History   Marital status: Married    Spouse name: Not on file   Number of children: Not on file   Years of education: Not on file   Highest education level: Not on file  Occupational History   Not on file  Tobacco Use   Smoking status: Every Day    Packs/day: 1.00    Types: Cigarettes   Smokeless tobacco: Never  Vaping Use   Vaping Use:  Never used  Substance and Sexual Activity   Alcohol use: No   Drug use: No   Sexual activity: Yes  Other Topics Concern   Not on file  Social History Narrative   Not on file   Social Determinants of Health   Financial Resource Strain: Not on file  Food Insecurity: Not on file  Transportation Needs: Not on file  Physical Activity: Not on file  Stress: Not on file  Social Connections: Not on file  Intimate Partner Violence: Not on file    Family History:    Family History  Problem Relation Age of Onset   Hypertension Father      ROS:  Please see the history of present illness.   All other ROS reviewed and negative.     Physical Exam/Data:   Vitals:   11/01/22 0500 11/01/22 0539 11/01/22 0600 11/01/22 0800  BP: (!) 146/97  (!) 120/92 120/85  Pulse: 82  79 76  Resp:      Temp:  97.8 F (36.6 C)    TempSrc:  Oral    SpO2: 98%  95% 94%  Weight:      Height:       No intake or output data in the 24 hours ending 11/01/22 0824    10/31/2022    6:43 PM 09/10/2017    5:24 AM 09/09/2017    6:00 PM  Last 3 Weights  Weight (lbs) 224 lb 3.3 oz 224 lb 3.2 oz 226 lb 10.1 oz  Weight (kg) 101.7 kg 101.696 kg 102.8 kg     Body mass index is 32.17 kg/m.   General appearance: alert and no distress Neck: no carotid bruit, no JVD, and thyroid not enlarged, symmetric, no tenderness/mass/nodules Lungs: clear to auscultation bilaterally Heart: regular rate and rhythm, S1, S2 normal, no murmur, click, rub or gallop Abdomen: soft, non-tender; bowel sounds normal; no masses,  no organomegaly Extremities: extremities normal, atraumatic, no cyanosis or edema Pulses: 2+ and symmetric Skin: Skin color, texture, turgor normal. No rashes or lesions Neurologic: Grossly normal Psych: Pleasant   Relevant CV Studies: N/A  Laboratory Data:  High Sensitivity Troponin:   Recent Labs  Lab 10/31/22 1846 10/31/22 2046  TROPONINIHS 3 3     Chemistry Recent Labs  Lab  10/31/22 1846 10/31/22 2334  NA 137  --   K 3.8  --   CL 102  --   CO2 26  --   GLUCOSE 189*  --   BUN 12  --   CREATININE 0.88 0.77  CALCIUM 8.9  --   MG  --  2.0  GFRNONAA >60 >60  ANIONGAP 9  --     No results for input(s): "PROT", "ALBUMIN", "AST", "ALT", "ALKPHOS", "BILITOT" in the last 168 hours. Lipids  Recent Labs  Lab 10/31/22 2334  CHOL 131  TRIG 123  HDL 37*  LDLCALC 69  CHOLHDL 3.5    Hematology Recent Labs  Lab 10/31/22 1846 10/31/22 2334  WBC 7.7 7.0  RBC 5.24 5.15  HGB 15.4 15.0  HCT 44.9 43.3  MCV 85.7 84.1  MCH 29.4 29.1  MCHC 34.3 34.6  RDW 12.9 13.0  PLT 295 267   Thyroid  Recent Labs  Lab 10/31/22 1846  TSH 0.948  FREET4 0.89    BNP Recent Labs  Lab 10/31/22 1944  BNP 8.6    DDimer No results for input(s): "DDIMER" in the last 168 hours.   Radiology/Studies:  DG Chest 2 View  Result Date: 10/31/2022 CLINICAL DATA:  Chest pain EXAM: CHEST - 2 VIEW COMPARISON:  Chest radiograph 09/08/2017 FINDINGS: The cardiomediastinal contours are within normal limits. The lungs are clear. No pneumothorax or pleural effusion. No acute finding in the visualized skeleton. IMPRESSION: No active cardiopulmonary disease. Electronically Signed   By: Audie Pinto M.D.   On: 10/31/2022 19:31     Assessment and Plan:   Chest pain- Mr. Seefeldt presents with chest pain but has had negative troponins.  He had a cath by Dr. Burt Knack in 2018 which showed mild diffuse nonobstructive coronary disease.  He has had uncontrolled diabetes which certainly could have led to some progression of coronary disease over the past 4 to 5 years.  Would recommend outpatient ischemia evaluation with a coronary CT angiogram.  The patient had been referred to see Dr. Otho Perl with cardiology in [facility], but has not established care.  He is scheduled for a new patient visit on November 04, 2022.  We are certainly happy to see him in follow-up at [facility] heart care after outpatient  testing.  It appears that echocardiogram has been ordered and we will follow-up on that, however it is not clear when this will be performed.  If he is discharged, the office will contact him to arrange for outpatient CT coronary angiography on Monday. Follow-up with me or APP afterwards.  Thanks for the consultation.    Pixie Casino, MD, [facility], [place] Director of the Advanced Lipid Disorders &  Cardiovascular Risk Reduction Clinic Diplomate of the American Board of Clinical Lipidology Attending Cardiologist  Direct Dial: [phone]  Fax: [phone]  Website:  [email or web address]

## 2022-11-01 NOTE — Progress Notes (Signed)
Called patient and spoke with him and his wife, wish to switch cardiology care to Dr. Debara Pickett office, no longer wanting to keep his new patient appointment with Dr. Otho Perl at [facility].   Outpatient CT coronary study ordered per Dr Debara Pickett request, message sent to Marchia Bond for arranging this test and provide instruction to the patient. Informed him to anticipate call from staff after Monday.  Follow-up arranged with cardiology in 11/16/2022 to review CT results.

## 2022-11-02 NOTE — Progress Notes (Unsigned)
Cardiology Office Note:    Date:  11/16/2022   ID:  Shane Merritt, DOB 1966/04/24, MRN [id number]  PCP:  Maggie Schwalbe, PA-C  Cardiologist:  Pixie Casino, MD  Electrophysiologist:  None   Referring MD: Maggie Schwalbe, PA-C   Chief Complaint: hospital follow-up for chest pain  History of Present Illness:    Shane Merritt is a 57 y.o. male with a history of mild non-obstructive CAD noted on cardiac catheterization in 2018 and recent coronary CTA in 10/2022, hypertension, hyperlipidemia, type 2 diabetes mellitus, GERD, and tobacco abuse who is followed by Dr. Debara Pickett and presents today for hospital follow-up of chest pain.   Patient was admitted in 08/2017 for chest pain. Cardiac catheterization at that time showed diffuse mild non-obstructive disease but no evidence of high grade coronary artery stenosis. Chest pain was felt to be non-cardiac in nature. He was started on Lipitor and Lovaza for significantly elevated triglycerides.   Patient was not seen again by Cardiology until recent admission. He was admitted from 10/31/2022 to 11/01/2022 for chest pain. He reported chest pain and shortness of breath with exertion (worse when walking or hunting). He stated chest pain would last for 30 minutes at a time and then slowly dissipate. He also was reportedly told he had an irregular heart rhythm at his Endocrinologist's office recently. EKG in the ED showed normal sinus rhythm, rate 88 bpm, with multiple PVC and Q waves in inferior leads bu no acute ST/T changes. High-sensitivity troponin was negative x2. BNP was normal. Labs were unremarkable. Cardiology was consulted and recommended an outpatient coronary CTA. Coronary CTA showed a coronary calcium score of 428 (95th percentile for age and sex) and mild non-obstructive CAD.  Patient presents today for follow-up. He is doing well since recent discharge. He denies any recurrent chest pain. No shortness of breath, orthopnea, PND, lower extremity  edema, palpitations, lightheadedness, dizziness or syncope. He stays very active with his job. 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 He was noted to have multiple PVCs on prior EKG and I can hear this on exam today as well but he is completely asymptomatic with this. He continues to smoke 4-5 cigarettes per day.  Past Medical History:  Diagnosis Date   Anginal pain ([place])    GERD (gastroesophageal reflux disease)    Hypercholesterolemia    /notes 10/18/2011   Hypertension    Non-obsructive CAD    a. LHC 10/2016: diffuse mild non-obstructive disease, b. coronary CTA in 10/2022: coronary calcium score of 428 (95th %ile) and mild non-obstructive CAD   Type II diabetes mellitus ([place])    "started RX last week" (09/09/2017)    Past Surgical History:  Procedure Laterality Date   LEFT HEART CATH AND CORONARY ANGIOGRAPHY N/A 09/10/2017   Procedure: LEFT HEART CATH AND CORONARY ANGIOGRAPHY;  Surgeon: Sherren Mocha, MD;  Location: [facility];  Service: Cardiovascular;  Laterality: N/A;   NO PAST SURGERIES      Current Medications: Current Meds  Medication Sig   atorvastatin (LIPITOR) 20 MG tablet Take 20 mg by mouth daily.   carvedilol (COREG) 3.125 MG tablet Take 1 tablet (3.125 mg total) by mouth 2 (two) times daily.   Insulin Glargine Solostar (LANTUS) 100 UNIT/ML Solostar Pen Inject 32 Units into the skin at bedtime.   insulin lispro (HUMALOG) 100 UNIT/ML KwikPen Inject 12 Units into the skin 3 (three) times daily before meals.  lisinopril (ZESTRIL) 20 MG tablet Take 20 mg by mouth daily.   metFORMIN (GLUCOPHAGE) 1000 MG tablet Take 1,000 mg by mouth 2 (two) times daily with a meal.   tirzepatide (MOUNJARO) 5 MG/0.5ML Pen Inject 5 mg into the skin once a week.   [DISCONTINUED] aspirin EC 81 MG tablet Take 1 tablet (81 mg total) by mouth daily.   [DISCONTINUED] metoprolol tartrate (LOPRESSOR) 100 MG tablet Take tablet (100mg ) TWO hours  prior to  his cardiac CT scan.     Allergies:   Patient has no known allergies.   Social History   Socioeconomic History   Marital status: Married    Spouse name: Not on file   Number of children: Not on file   Years of education: Not on file   Highest education level: Not on file  Occupational History   Not on file  Tobacco Use   Smoking status: Every Day    Packs/day: 1.00    Types: Cigarettes   Smokeless tobacco: Never  Vaping Use   Vaping Use: Never used  Substance and Sexual Activity   Alcohol use: No   Drug use: No   Sexual activity: Yes  Other Topics Concern   Not on file  Social History Narrative   Not on file   Social Determinants of Health   Financial Resource Strain: Not on file  Food Insecurity: Not on file  Transportation Needs: Not on file  Physical Activity: Not on file  Stress: Not on file  Social Connections: Not on file     Family History: The patient's family history includes Hypertension in his father.  ROS:   Please see the history of present illness.     EKGs/Labs/Other Studies Reviewed:    The following studies were reviewed:  Left Cardiac Catheterization 09/10/2017: 1.  Diffuse nonobstructive coronary artery disease without any evidence of high-grade coronary stenosis 2.  Normal LVEDP   Recommend: Medical therapy for nonobstructive coronary artery disease.  Suspect noncardiac chest pain.  Diagnostic Dominance: Right      EKG:  EKG not ordered today.   Recent Labs: 10/31/2022: B Natriuretic Peptide 8.6; BUN 12; Creatinine, Ser 0.77; Hemoglobin 15.0; Magnesium 2.0; Platelets 267; Potassium 3.8; Sodium 137; TSH 0.948  Recent Lipid Panel    Component Value Date/Time   CHOL 131 10/31/2022 2334   TRIG 123 10/31/2022 2334   HDL 37 (L) 10/31/2022 2334   CHOLHDL 3.5 10/31/2022 2334   VLDL 25 10/31/2022 2334   LDLCALC 69 10/31/2022 2334    Physical Exam:    Vital Signs: BP (!) 148/98   Pulse 82   Ht 5\' 10"  (1.778 m)   Wt  212 lb (96.2 kg)   BMI 30.42 kg/m     Wt Readings from Last 3 Encounters:  11/16/22 212 lb (96.2 kg)  10/31/22 224 lb 3.3 oz (101.7 kg)  09/10/17 224 lb 3.2 oz (101.7 kg)     General: 57 y.o. Caucasian male in no acute distress. HEENT: Normocephalic and atraumatic. Sclera clear.  Neck: Supple. No carotid bruits. No JVD. Heart: Irregular rhythm (due to frequent ectopy) and normal rate. Distinct S1 and S2. No murmurs, gallops, or rubs. Radial pulses 2+ and equal bilaterally. Lungs: No increased work of breathing. Clear to ausculation bilaterally. No wheezes, rhonchi, or rales.  Abdomen: Soft, non-distended, and non-tender to palpation.  Extremities: No lower extremity edema.    Skin: Warm and dry. Neuro: Alert and oriented x3. No focal deficits. Psych: Normal affect. Responds  appropriately.  Assessment:    1. Coronary artery disease involving native coronary artery of native heart without angina pectoris   2. Frequent PVCs   3. Primary hypertension   4. Hyperlipidemia, unspecified hyperlipidemia type   5. Type 2 diabetes mellitus with complication, with long-term current use of insulin ([place])   6. Tobacco abuse     Plan:    Chest Pain Non-Obstructive CAD Patient has a history of mild non-obstructive CAD noted on cardiac catheterization in 2018. He was recently admitted earlier this month for chest pain. EKG showed no acute ischemic changes and high-sensitivity troponin was negative. Outpatient coronary CTA was ordered and showed a coronary calcium score of 428 (95th percentile for age and sex) and mild non-obstructive CAD. - No recurrent chest pain. - Will start Aspirin 81mg  daily. - Continue Lipitor 20mg  daily.  Frequent PVCs Patient was noted to have multiple PVCs on EKG in the hospital. Electrolytes were OK. - Frequent ectopy on exam that sounds consistent with PVCs (occurring about every 5th beat). He is completely asymptomatic with this. - He states he is not taking  Lopressor as prescribed. Will start Coreg 3.125mg  twice daily instead for better BP control. - If still having frequent PVCs after addition of beta-blocker, can consider monitor to assess PVC burden. May need to consider Echo as well to assess LV function if he continues to have frequent PVCs. He has no signs or symptoms of CHF at this time so will hold of on this for now.  Of note, patient did mention that he has some erectile dysfunction. I did explain that beta-blockers can some times cause this as well. Advised him to let us know if his erectile dysfunction worsens on the Coreg. If so, we can switch to Diltiazem.   Hypertension BP elevated. Initially 140/98 and then 148/98 on my personal recheck at the end of visit. - Continue Lisionpril 20mg  daily. - Patient states he is not taking Lopressor 12.5mg  twice daily. Will start Coreg 3.125mg  twice daily instead. - Asked patient to keep a BP/HR log for 2 week and then send [place] a MyChart message to see if any additional changes need to be made.  Hyperlipidemia Recent lipid panel on 10/31/2022: Total Cholesterol 131, Triglycerides 123, HDL 37, LDL 69. LDL<70 given non-obstructive CAD. - Continue Lipitor 20mg  daily. - He states he is no longer taking Lovaza.  Type 2 Diabetes Mellitus - Uncontrolled Hemoglobin A1c 10.1 on 10/31/2022.  - On Metformin and Insulin (both Lantus and Humalog). He is about to start wearing an insulin pump. - Managed by Endocrinology at [facility].  Tobacco Abuse Patient continues to smoke 4-5 cigarettes per day. - Discussed the importance of complete cessation.  - Will provide prescription of Nicotine patches per patient request.  Disposition: Follow up in 6 months.   Medication Adjustments/Labs and Tests Ordered: Current medicines are reviewed at length with the patient today.  Concerns regarding medicines are outlined above.  No orders of the defined types were placed in this encounter.  Meds  ordered this encounter  Medications   DISCONTD: nicotine (NICODERM CQ - DOSED IN MG/24 HOURS) 14 mg/24hr patch    Sig: Place 1 patch (14 mg total) onto the skin daily.    Dispense:  28 patch    Refill:  1   DISCONTD: aspirin EC 81 MG tablet    Sig: Take 1 tablet (81 mg total) by mouth daily.    Dispense:  90 tablet  Refill:  3   carvedilol (COREG) 3.125 MG tablet    Sig: Take 1 tablet (3.125 mg total) by mouth 2 (two) times daily.    Dispense:  180 tablet    Refill:  3   aspirin EC 81 MG tablet    Sig: Take 1 tablet (81 mg total) by mouth daily.    Dispense:  90 tablet    Refill:  3   nicotine (NICODERM CQ - DOSED IN MG/24 HOURS) 14 mg/24hr patch    Sig: Place 1 patch (14 mg total) onto the skin daily.    Dispense:  28 patch    Refill:  1    Patient Instructions  Medication Instructions:  Aspirin 81 mg daily Carvedilol 3.125 mg twice daily *If you need a refill on your cardiac medications before your next appointment, please call your pharmacy*   Follow-Up: At [facility], you and your health needs are our priority.  As part of our continuing mission to provide you with exceptional heart care, we have created designated Provider Care Teams.  These Care Teams include your primary Cardiologist (physician) and Advanced Practice Providers (APPs -  Physician Assistants and Nurse Practitioners) who all work together to provide you with the care you need, when you need it.  We recommend signing up for the patient portal called "MyChart".  Sign up information is provided on this After Visit Summary.  MyChart is used to connect with patients for Virtual Visits (Telemedicine).  Patients are able to view lab/test results, encounter notes, upcoming appointments, etc.  Non-urgent messages can be sent to your provider as well.   To learn more about what you can do with MyChart, go to [email or web address].    Your next appointment:   6 month(s)  Provider:   Appointment with  APP or Dr Rennis Golden   Other Instructions Keep BP and Heart rate log for 2 weeks and send in by MyChart   Signed, Corrin Parker, PA-C  11/16/2022 8:41 AM    [facility]

## 2022-11-05 ENCOUNTER — Telehealth (HOSPITAL_COMMUNITY): Payer: Self-pay | Admitting: Emergency Medicine

## 2022-11-05 NOTE — Telephone Encounter (Signed)
Attempted to call patient regarding upcoming cardiac CT appointment. Left message on voicemail with name and callback number Marchia Bond RN Navigator Cardiac Section Heart and Vascular Services [phone] Office [phone] Cell

## 2022-11-06 ENCOUNTER — Ambulatory Visit (HOSPITAL_COMMUNITY)
Admission: RE | Admit: 2022-11-06 | Discharge: 2022-11-06 | Disposition: A | Discharge status: Home / Self Care | Payer: Medicaid Other | Source: Ambulatory Visit | Attending: Internal Medicine | Admitting: Internal Medicine

## 2022-11-06 ENCOUNTER — Encounter (HOSPITAL_COMMUNITY): Payer: Self-pay

## 2022-11-06 DIAGNOSIS — R079 Chest pain, unspecified: Secondary | ICD-10-CM

## 2022-11-06 MED ORDER — NITROGLYCERIN 0.4 MG SL SUBL: 0.8000 mg | SUBLINGUAL_TABLET | Freq: Once | SUBLINGUAL | Status: DC

## 2022-11-09 ENCOUNTER — Ambulatory Visit (HOSPITAL_COMMUNITY)
Admission: RE | Admit: 2022-11-09 | Discharge: 2022-11-09 | Disposition: A | Discharge status: Home / Self Care | Payer: Medicaid Other | Source: Ambulatory Visit | Attending: Internal Medicine | Admitting: Internal Medicine

## 2022-11-09 ENCOUNTER — Other Ambulatory Visit (HOSPITAL_COMMUNITY): Payer: Self-pay | Admitting: *Deleted

## 2022-11-09 MED ORDER — METOPROLOL TARTRATE 100 MG PO TABS: ORAL_TABLET | ORAL | 0 refills | Status: DC

## 2022-11-09 NOTE — Progress Notes (Signed)
Patient arrived for Cardiac CT today rescheduled from 11/06/22 due to elevated heart rate. Did not take Metoprolol 100mg  PO for pre medication today. Heart rate 85. Took additional dose of Lisinopril instead. Spoke with nurse navigator rescheduled for 11/10/22 at 1600. Explained to patient medication sent to his pharmacy [facility] for him to pick up Metoprolol 100mg  and to take at 1400 arriving at 1530 for Cardiac CT as well as not to take additional does of Lisinopril. Patient verbalized understanding of plan of care.

## 2022-11-10 ENCOUNTER — Ambulatory Visit (HOSPITAL_COMMUNITY)
Admission: RE | Admit: 2022-11-10 | Discharge: 2022-11-10 | Disposition: A | Discharge status: Home / Self Care | Payer: Medicaid Other | Source: Ambulatory Visit | Attending: Internal Medicine | Admitting: Internal Medicine

## 2022-11-10 DIAGNOSIS — R079 Chest pain, unspecified: Secondary | ICD-10-CM | POA: Diagnosis not present

## 2022-11-10 LAB — GLUCOSE, CAPILLARY: Glucose-Capillary: 308 mg/dL — ABNORMAL HIGH (ref 70–99)

## 2022-11-10 MED ORDER — METOPROLOL TARTRATE 5 MG/5ML IV SOLN
INTRAVENOUS | Status: AC
  Filled 2022-11-10: qty 10

## 2022-11-10 MED ORDER — IOHEXOL 350 MG/ML SOLN
100.0000 mL | Freq: Once | INTRAVENOUS | Status: AC | PRN
  Administered 2022-11-10: 100 mL via INTRAVENOUS

## 2022-11-10 MED ORDER — METOPROLOL TARTRATE 5 MG/5ML IV SOLN
INTRAVENOUS | Status: AC
  Administered 2022-11-10: 10 mg via INTRAVENOUS
  Filled 2022-11-10: qty 10

## 2022-11-10 MED ORDER — DILTIAZEM HCL 25 MG/5ML IV SOLN
INTRAVENOUS | Status: AC
  Filled 2022-11-10: qty 5

## 2022-11-10 MED ORDER — METOPROLOL TARTRATE 5 MG/5ML IV SOLN: 10.0000 mg | INTRAVENOUS | Status: DC | PRN

## 2022-11-10 MED ORDER — NITROGLYCERIN 0.4 MG SL SUBL
SUBLINGUAL_TABLET | SUBLINGUAL | Status: AC
  Filled 2022-11-10: qty 2

## 2022-11-10 MED ORDER — NITROGLYCERIN 0.4 MG SL SUBL
0.8000 mg | SUBLINGUAL_TABLET | Freq: Once | SUBLINGUAL | Status: AC
  Administered 2022-11-10: 0.8 mg via SUBLINGUAL

## 2022-11-10 MED ORDER — DILTIAZEM HCL 25 MG/5ML IV SOLN
10.0000 mg | Freq: Once | INTRAVENOUS | Status: AC
  Administered 2022-11-10: 10 mg via INTRAVENOUS

## 2022-11-16 ENCOUNTER — Encounter: Payer: Self-pay | Admitting: Student

## 2022-11-16 ENCOUNTER — Ambulatory Visit: Payer: Medicaid Other | Attending: Student | Admitting: Student

## 2022-11-16 VITALS — BP 148/98 | HR 82 | Ht 70.0 in | Wt 212.0 lb

## 2022-11-16 DIAGNOSIS — I493 Ventricular premature depolarization: Secondary | ICD-10-CM

## 2022-11-16 DIAGNOSIS — I10 Essential (primary) hypertension: Secondary | ICD-10-CM

## 2022-11-16 DIAGNOSIS — Z794 Long term (current) use of insulin: Secondary | ICD-10-CM

## 2022-11-16 DIAGNOSIS — I2510 Atherosclerotic heart disease of native coronary artery without angina pectoris: Secondary | ICD-10-CM | POA: Insufficient documentation

## 2022-11-16 DIAGNOSIS — E785 Hyperlipidemia, unspecified: Secondary | ICD-10-CM | POA: Diagnosis not present

## 2022-11-16 DIAGNOSIS — E118 Type 2 diabetes mellitus with unspecified complications: Secondary | ICD-10-CM

## 2022-11-16 DIAGNOSIS — Z720 Tobacco use: Secondary | ICD-10-CM

## 2022-11-16 MED ORDER — ASPIRIN 81 MG PO TBEC: 81.0000 mg | DELAYED_RELEASE_TABLET | Freq: Every day | ORAL | 3 refills | Status: DC

## 2022-11-16 MED ORDER — ASPIRIN 81 MG PO TBEC: 81.0000 mg | DELAYED_RELEASE_TABLET | Freq: Every day | ORAL | 3 refills | Status: AC

## 2022-11-16 MED ORDER — NICOTINE 14 MG/24HR TD PT24: 14.0000 mg | MEDICATED_PATCH | Freq: Every day | TRANSDERMAL | 1 refills | Status: DC

## 2022-11-16 MED ORDER — NICOTINE 14 MG/24HR TD PT24: 14.0000 mg | MEDICATED_PATCH | Freq: Every day | TRANSDERMAL | 1 refills | Status: AC

## 2022-11-16 MED ORDER — CARVEDILOL 3.125 MG PO TABS: 3.1250 mg | ORAL_TABLET | Freq: Two times a day (BID) | ORAL | 3 refills | Status: AC

## 2022-11-16 NOTE — Patient Instructions (Signed)
Medication Instructions:  Aspirin 81 mg daily Carvedilol 3.125 mg twice daily *If you need a refill on your cardiac medications before your next appointment, please call your pharmacy*   Follow-Up: At [facility], you and your health needs are our priority.  As part of our continuing mission to provide you with exceptional heart care, we have created designated Provider Care Teams.  These Care Teams include your primary Cardiologist (physician) and Advanced Practice Providers (APPs -  Physician Assistants and Nurse Practitioners) who all work together to provide you with the care you need, when you need it.  We recommend signing up for the patient portal called "MyChart".  Sign up information is provided on this After Visit Summary.  MyChart is used to connect with patients for Virtual Visits (Telemedicine).  Patients are able to view lab/test results, encounter notes, upcoming appointments, etc.  Non-urgent messages can be sent to your provider as well.   To learn more about what you can do with MyChart, go to [email or web address].    Your next appointment:   6 month(s)  Provider:   Appointment with APP or Dr Debara Pickett   Other Instructions Keep BP and Heart rate log for 2 weeks and send in by MyChart

## 2023-02-10 ENCOUNTER — Telehealth: Payer: Self-pay | Admitting: Internal Medicine

## 2023-02-10 NOTE — Telephone Encounter (Signed)
  DECIDE DATA REGISTRY  Medications adjusted by Marjie Skiff, PA-C based on the results from coronary CT on 11/10/2022  Did you add a medication? Yes.    ?If yes, how many? Number of meds added: 2  ?If no, reason? N/A  Did you remove a medication? No.  Did you increase the dosage of any medication? No.  Did you decrease the dosage of any medication? No.  Did you refer to a specialist (i.e. lipid clinic, preventive cardiology, endocrinology)? No.  Has patient seen plaque report? No.  Chrystie Nose, MD, [facility], FACP  [place]  [facility]  Medical Director of the Advanced Lipid Disorders &  Cardiovascular Risk Reduction Clinic Diplomate of the American Board of Clinical Lipidology Attending Cardiologist  Direct Dial: [phone]  Fax: [phone]  Website:  [email or web address]

## 2023-12-22 NOTE — H&P (Signed)
 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  CC: Bad teeth. Fluid comes out my nose when I drink.  HPI: Had extraction upper left tooth 2 y ago and has a bubble that formed and will come and go. Fluid goes from mouth to nose when drinking.  Past Medical History:  High Blood Pressure, Irregular Heart Beat, Snoring, Diabetes, High Cholesterol    Medications: Humalog, Lisinopril, Atorvastatin    Allergies:     NKDA    Surgeries:   Oral Surgery     Social History       Smoking:1/2/ppd            Alcohol: Drug use:                             Exam: BMI 29. Large decay # 21, 29, 31. There is a 1 cm x 1 cm oval opening left maxillary alveolar ridge at # 14/15 area with thin mobile mucosal tissue visible in the opening.  No purulence, edema, fluctuance, trismus. Oral cancer screening negative. Pharynx clear. No lymphadenopathy.  Panorex:Large decay # 21, 29, 31. Thin left sinus inferior border with sinus pneumatization.  Assessment: ASA 2. Non-restorable   21, 29, 31. Left oral-antral fistula            Plan: 1. Preop CT sinus done.  2. Extraction Teeth # 21, 29, 31. Closure left oral antral communication/fistula . Possible sinusotomy.  Hospital Day surgery.                 Rx: n               Risks and complications explained. Questions answered.   Georgia Lopes, DMD

## 2023-12-23 ENCOUNTER — Encounter (HOSPITAL_COMMUNITY): Payer: Self-pay | Admitting: Oral Surgery

## 2023-12-23 ENCOUNTER — Other Ambulatory Visit: Payer: Self-pay

## 2023-12-23 NOTE — Progress Notes (Signed)
 Anesthesia Chart Review:  58 year old male with multiple prior benign cardiology workups for chest pain.  He had diffuse mild nonobstructive CAD on cardiac cath 2018 and more recently a coronary CTA in 10/2022 showing nonobstructive CAD.  He was recommended to continue Lipitor and take ASA 81 mg daily.  He was also started on Coreg 3.125 mg twice daily for BP control as well as for frequent PVCs.  Frequent PVCs were noted on EKG, patient completely asymptomatic of these.  History of uncontrolled IDDM 2, followed [facility]ocr[facility] at [facility].  Per notes, significant improvement in glycemic control after starting insulin pump, A1c 6.5 on 07/12/2023.  Patient will need day of surgery labs and evaluation.  EKG 08/06/2023 (Care Everywhere): NSR.  Rate 92.  Nonspecific ST abnormality.  Coronary CTA 11/10/2022:  IMPRESSION: 1. Calcium score 428 which is 95 th percentile for age/sex   2.  Normal ascending thoracic aorta 3.3 cm   3.  CAD RADS 2 non obstructive CAD see description above  Cath 09/10/2017: 1.  Diffuse nonobstructive coronary artery disease without any evidence of high-grade coronary stenosis 2.  Normal LVEDP   Recommend: Medical therapy for nonobstructive coronary artery disease.  Suspect noncardiac chest pain.    Zannie Cove [facility] Stay [facility]/Anesthesiology Phone [phone] 12/23/2023 11:03 AM

## 2023-12-23 NOTE — Progress Notes (Signed)
 SDW CALL  Patient was given pre-op instructions over the phone. The opportunity was given for the patient to ask questions. No further questions asked. Patient verbalized understanding of instructions given.   PCP - Dr. Sterling Big Cardiologist - Dr. Zoila Shutter      -LOV- 11/16/22 with recommended follow-up in 6 months  PPM/ICD - denies Device Orders - n/a Rep Notified -  n/a  Chest x-ray - 11/11/22 EKG - DOS Stress Test -  ECHO -  Cardiac Cath - 2018  Sleep Study - denies CPAP - n/a  Fasting Blood Sugar - 180 Patient wears a continuous glucose monitor on his right arm;  patient also has an insulin pump and was instructed to leave on insulin pump and to reduce his basal rate by 20% at midnight  Last dose of GLP1 agonist-  n/a GLP1 instructions: n/a  Blood Thinner Instructions:  n/a Aspirin Instructions: patient is no longer taking Aspirin - patient states that it has been a long time since he last took it.   ERAS Protcol - NPO PRE-SURGERY Ensure or G2- n/a  COVID TEST- n/a   Anesthesia review: yes - CAD,   Patient denies shortness of breath, fever, cough and chest pain over the phone call   All instructions explained to the patient, with a verbal understanding of the material. Patient agrees to go over the instructions while at home for a better understanding.

## 2023-12-23 NOTE — Anesthesia Preprocedure Evaluation (Addendum)
 Anesthesia Evaluation  Patient identified by MRN, date of birth, ID band Patient awake    Reviewed: Allergy & Precautions, NPO status , Patient's Chart, lab work & pertinent test results  Airway Mallampati: II  TM Distance: >3 FB Neck ROM: Full    Dental  (+) Teeth Intact, Dental Advisory Given   Pulmonary Current Smoker and Patient abstained from smoking.   breath sounds clear to auscultation       Cardiovascular hypertension, Pt. on medications and Pt. on home beta blockers + CAD   Rhythm:Regular Rate:Normal     Neuro/Psych negative neurological ROS  negative psych ROS   GI/Hepatic Neg liver ROS,GERD  ,,  Endo/Other  diabetes, Type 2    Renal/GU negative Renal ROS     Musculoskeletal negative musculoskeletal ROS (+)    Abdominal   Peds  Hematology negative hematology ROS (+)   Anesthesia Other Findings   Reproductive/Obstetrics                             Anesthesia Physical Anesthesia Plan  ASA: 2  Anesthesia Plan: General   Post-op Pain Management: Minimal or no pain anticipated   Induction: Intravenous  PONV Risk Score and Plan: 2 and Ondansetron and Midazolam  Airway Management Planned: Nasal ETT  Additional Equipment: None  Intra-op Plan:   Post-operative Plan: Extubation in OR  Informed Consent: I have reviewed the patients History and Physical, chart, labs and discussed the procedure including the risks, benefits and alternatives for the proposed anesthesia with the patient or authorized representative who has indicated his/her understanding and acceptance.     Dental advisory given  Plan Discussed with: CRNA  Anesthesia Plan Comments: (PAT note by Antionette Poles, PA-C: 58 year old male with multiple prior benign cardiology workups for chest pain.  He had diffuse mild nonobstructive CAD on cardiac cath 2018 and more recently a coronary CTA in 10/2022 showing  nonobstructive CAD.  He was recommended to continue Lipitor and take ASA 81 mg daily.  He was also started on Coreg 3.125 mg twice daily for BP control as well as for frequent PVCs.  Frequent PVCs were noted on EKG, patient completely asymptomatic of these.  History of uncontrolled IDDM 2, followed by endocrinology at [facility].  Per notes, significant improvement in glycemic control after starting insulin pump, A1c 6.5 on 07/12/2023.  Patient will need day of surgery labs and evaluation.  EKG 08/06/2023 (Care Everywhere): NSR.  Rate 92.  Nonspecific ST abnormality.  Coronary CTA 11/10/2022: IMPRESSION: 1. Calcium score 428 which is 95 th percentile for age/sex  2.  Normal ascending thoracic aorta 3.3 cm  3.  CAD RADS 2 non obstructive CAD see description above  Cath 09/10/2017: 1.  Diffuse nonobstructive coronary artery disease without any evidence of high-grade coronary stenosis 2.  Normal LVEDP  Recommend: Medical therapy for nonobstructive coronary artery disease.  Suspect noncardiac chest pain.  )        Anesthesia Quick Evaluation

## 2023-12-24 ENCOUNTER — Ambulatory Visit (HOSPITAL_COMMUNITY)
Admission: RE | Admit: 2023-12-24 | Discharge: 2023-12-24 | Disposition: A | Discharge status: Home / Self Care | Payer: Medicaid Other | Attending: Oral Surgery | Admitting: Oral Surgery

## 2023-12-24 ENCOUNTER — Ambulatory Visit (HOSPITAL_COMMUNITY): Payer: Self-pay | Admitting: Physician Assistant

## 2023-12-24 ENCOUNTER — Ambulatory Visit (HOSPITAL_BASED_OUTPATIENT_CLINIC_OR_DEPARTMENT_OTHER): Payer: Self-pay | Admitting: Physician Assistant

## 2023-12-24 ENCOUNTER — Other Ambulatory Visit: Payer: Self-pay

## 2023-12-24 ENCOUNTER — Encounter (HOSPITAL_COMMUNITY)
Admission: RE | Disposition: A | Discharge status: Home / Self Care | Payer: Self-pay | Source: Home / Self Care | Attending: Oral Surgery

## 2023-12-24 ENCOUNTER — Encounter (HOSPITAL_COMMUNITY): Payer: Self-pay | Admitting: Oral Surgery

## 2023-12-24 DIAGNOSIS — F17210 Nicotine dependence, cigarettes, uncomplicated: Secondary | ICD-10-CM | POA: Diagnosis not present

## 2023-12-24 DIAGNOSIS — I10 Essential (primary) hypertension: Secondary | ICD-10-CM | POA: Insufficient documentation

## 2023-12-24 DIAGNOSIS — J320 Chronic maxillary sinusitis: Secondary | ICD-10-CM | POA: Insufficient documentation

## 2023-12-24 DIAGNOSIS — K029 Dental caries, unspecified: Secondary | ICD-10-CM | POA: Diagnosis not present

## 2023-12-24 DIAGNOSIS — I2510 Atherosclerotic heart disease of native coronary artery without angina pectoris: Secondary | ICD-10-CM | POA: Diagnosis not present

## 2023-12-24 DIAGNOSIS — Z794 Long term (current) use of insulin: Secondary | ICD-10-CM | POA: Insufficient documentation

## 2023-12-24 DIAGNOSIS — E119 Type 2 diabetes mellitus without complications: Secondary | ICD-10-CM | POA: Insufficient documentation

## 2023-12-24 DIAGNOSIS — J338 Other polyp of sinus: Secondary | ICD-10-CM | POA: Diagnosis not present

## 2023-12-24 HISTORY — PX: TOOTH EXTRACTION: SHX859

## 2023-12-24 LAB — GLUCOSE, CAPILLARY
Glucose-Capillary: 177 mg/dL — ABNORMAL HIGH (ref 70–99)
Glucose-Capillary: 176 mg/dL — ABNORMAL HIGH (ref 70–99)

## 2023-12-24 LAB — CBC
HCT: 43.7 % (ref 39.0–52.0)
Hemoglobin: 14.8 g/dL (ref 13.0–17.0)
MCH: 29.3 pg (ref 26.0–34.0)
MCHC: 33.9 g/dL (ref 30.0–36.0)
MCV: 86.5 fL (ref 80.0–100.0)
Platelets: 276 10*3/uL (ref 150–400)
RBC: 5.05 MIL/uL (ref 4.22–5.81)
RDW: 13.2 % (ref 11.5–15.5)
WBC: 8.2 10*3/uL (ref 4.0–10.5)
nRBC: 0 % (ref 0.0–0.2)

## 2023-12-24 LAB — BASIC METABOLIC PANEL
Anion gap: 7 (ref 5–15)
BUN: 14 mg/dL (ref 6–20)
CO2: 27 mmol/L (ref 22–32)
Calcium: 8.6 mg/dL — ABNORMAL LOW (ref 8.9–10.3)
Chloride: 101 mmol/L (ref 98–111)
Creatinine, Ser: 0.86 mg/dL (ref 0.61–1.24)
GFR, Estimated: >60 mL/min (ref >60)
Glucose, Bld: 175 mg/dL — ABNORMAL HIGH (ref 70–99)
Potassium: 4.4 mmol/L (ref 3.5–5.1)
Sodium: 135 mmol/L (ref 135–145)

## 2023-12-24 SURGERY — DENTAL RESTORATION/EXTRACTIONS
Anesthesia: General

## 2023-12-24 MED ORDER — PROPOFOL 10 MG/ML IV BOLUS
INTRAVENOUS | Status: DC | PRN
  Administered 2023-12-24: 150 mg via INTRAVENOUS
  Administered 2023-12-24: 50 mg via INTRAVENOUS

## 2023-12-24 MED ORDER — SODIUM CHLORIDE 0.9 % IR SOLN
Status: DC | PRN
  Administered 2023-12-24: 250 mL

## 2023-12-24 MED ORDER — DEXAMETHASONE SODIUM PHOSPHATE 10 MG/ML IJ SOLN
INTRAMUSCULAR | Status: DC | PRN
  Administered 2023-12-24: 5 mg via INTRAVENOUS

## 2023-12-24 MED ORDER — ROCURONIUM BROMIDE 10 MG/ML (PF) SYRINGE
PREFILLED_SYRINGE | INTRAVENOUS | Status: AC
Start: 2023-12-24 — End: ?
  Filled 2023-12-24: qty 10

## 2023-12-24 MED ORDER — FENTANYL CITRATE (PF) 250 MCG/5ML IJ SOLN
INTRAMUSCULAR | Status: AC
  Filled 2023-12-24: qty 5

## 2023-12-24 MED ORDER — EPHEDRINE SULFATE (PRESSORS) 50 MG/ML IJ SOLN
INTRAMUSCULAR | Status: DC | PRN
  Administered 2023-12-24: 5 mg via INTRAVENOUS
  Administered 2023-12-24 (×2): 10 mg via INTRAVENOUS

## 2023-12-24 MED ORDER — SUCCINYLCHOLINE CHLORIDE 200 MG/10ML IV SOSY
PREFILLED_SYRINGE | INTRAVENOUS | Status: DC | PRN
  Administered 2023-12-24: 160 mg via INTRAVENOUS

## 2023-12-24 MED ORDER — PHENYLEPHRINE 80 MCG/ML (10ML) SYRINGE FOR IV PUSH (FOR BLOOD PRESSURE SUPPORT)
PREFILLED_SYRINGE | INTRAVENOUS | Status: DC | PRN
Start: 2023-12-24 — End: 2023-12-24
  Administered 2023-12-24 (×4): 160 ug via INTRAVENOUS

## 2023-12-24 MED ORDER — ONDANSETRON HCL 4 MG/2ML IJ SOLN
INTRAMUSCULAR | Status: DC | PRN
  Administered 2023-12-24: 4 mg via INTRAVENOUS

## 2023-12-24 MED ORDER — ACETAMINOPHEN 160 MG/5ML PO SOLN: 325.0000 mg | ORAL | Status: DC | PRN

## 2023-12-24 MED ORDER — LIDOCAINE 2% (20 MG/ML) 5 ML SYRINGE
INTRAMUSCULAR | Status: DC | PRN
  Administered 2023-12-24: 60 mg via INTRAVENOUS

## 2023-12-24 MED ORDER — AMOXICILLIN-POT CLAVULANATE 875-125 MG PO TABS: 1.0000 | ORAL_TABLET | Freq: Two times a day (BID) | ORAL | 0 refills | Status: AC

## 2023-12-24 MED ORDER — OXYCODONE-ACETAMINOPHEN 5-325 MG PO TABS: 1.0000 | ORAL_TABLET | ORAL | 0 refills | Status: AC | PRN

## 2023-12-24 MED ORDER — PROPOFOL 10 MG/ML IV BOLUS
INTRAVENOUS | Status: AC
  Filled 2023-12-24: qty 20

## 2023-12-24 MED ORDER — MIDAZOLAM HCL 2 MG/2ML IJ SOLN
INTRAMUSCULAR | Status: AC
  Filled 2023-12-24: qty 2

## 2023-12-24 MED ORDER — OXYMETAZOLINE HCL 0.05 % NA SOLN
NASAL | Status: AC
  Filled 2023-12-24: qty 30

## 2023-12-24 MED ORDER — MIDAZOLAM HCL 2 MG/2ML IJ SOLN
INTRAMUSCULAR | Status: DC | PRN
  Administered 2023-12-24: 1 mg via INTRAVENOUS

## 2023-12-24 MED ORDER — OXYCODONE HCL 5 MG PO TABS: 5.0000 mg | ORAL_TABLET | Freq: Once | ORAL | Status: DC | PRN

## 2023-12-24 MED ORDER — INSULIN ASPART 100 UNIT/ML IJ SOLN
INTRAMUSCULAR | Status: DC | PRN
  Administered 2023-12-24: 6 [IU] via SUBCUTANEOUS

## 2023-12-24 MED ORDER — CLARITIN-D 24 HOUR 10-240 MG PO TB24: 1.0000 | ORAL_TABLET | Freq: Every day | ORAL | 0 refills | Status: AC

## 2023-12-24 MED ORDER — LACTATED RINGERS IV SOLN: INTRAVENOUS | Status: DC

## 2023-12-24 MED ORDER — FENTANYL CITRATE (PF) 100 MCG/2ML IJ SOLN
25.0000 ug | INTRAMUSCULAR | Status: DC | PRN
Start: 2023-12-24 — End: 2023-12-24

## 2023-12-24 MED ORDER — FENTANYL CITRATE (PF) 250 MCG/5ML IJ SOLN
INTRAMUSCULAR | Status: DC | PRN
  Administered 2023-12-24: 50 ug via INTRAVENOUS

## 2023-12-24 MED ORDER — ROCURONIUM BROMIDE 10 MG/ML (PF) SYRINGE
PREFILLED_SYRINGE | INTRAVENOUS | Status: DC | PRN
  Administered 2023-12-24: 4 mg via INTRAVENOUS

## 2023-12-24 MED ORDER — NICOTINE 21 MG/24HR TD PT24: 21.0000 mg | MEDICATED_PATCH | Freq: Every day | TRANSDERMAL | 0 refills | Status: AC

## 2023-12-24 MED ORDER — CEFAZOLIN SODIUM-DEXTROSE 2-4 GM/100ML-% IV SOLN
INTRAVENOUS | Status: AC
  Filled 2023-12-24: qty 100

## 2023-12-24 MED ORDER — LIDOCAINE-EPINEPHRINE 1 %-1:100000 IJ SOLN
INTRAMUSCULAR | Status: AC
  Filled 2023-12-24: qty 1

## 2023-12-24 MED ORDER — LIDOCAINE 2% (20 MG/ML) 5 ML SYRINGE
INTRAMUSCULAR | Status: AC
  Filled 2023-12-24: qty 5

## 2023-12-24 MED ORDER — LIDOCAINE-EPINEPHRINE 1 %-1:100000 IJ SOLN
INTRAMUSCULAR | Status: DC | PRN
  Administered 2023-12-24: 10 mL

## 2023-12-24 MED ORDER — DROPERIDOL 2.5 MG/ML IJ SOLN: 0.6250 mg | Freq: Once | INTRAMUSCULAR | Status: DC | PRN

## 2023-12-24 MED ORDER — ACETAMINOPHEN 10 MG/ML IV SOLN: 1000.0000 mg | Freq: Once | INTRAVENOUS | Status: DC | PRN

## 2023-12-24 MED ORDER — CHLORHEXIDINE GLUCONATE 0.12 % MT SOLN
15.0000 mL | Freq: Once | OROMUCOSAL | Status: AC
  Administered 2023-12-24: 15 mL via OROMUCOSAL
  Filled 2023-12-24: qty 15

## 2023-12-24 MED ORDER — OXYMETAZOLINE HCL 0.05 % NA SOLN
NASAL | Status: DC | PRN
  Administered 2023-12-24: 1 via NASAL
  Administered 2023-12-24: 2 via NASAL

## 2023-12-24 MED ORDER — OXYCODONE HCL 5 MG/5ML PO SOLN: 5.0000 mg | Freq: Once | ORAL | Status: DC | PRN

## 2023-12-24 MED ORDER — CEFAZOLIN SODIUM-DEXTROSE 2-4 GM/100ML-% IV SOLN
2.0000 g | INTRAVENOUS | Status: AC
  Administered 2023-12-24: 2 g via INTRAVENOUS

## 2023-12-24 MED ORDER — 0.9 % SODIUM CHLORIDE (POUR BTL) OPTIME
TOPICAL | Status: DC | PRN
  Administered 2023-12-24: 1000 mL

## 2023-12-24 MED ORDER — LIDOCAINE-EPINEPHRINE 2 %-1:100000 IJ SOLN: INTRAMUSCULAR | Status: DC | PRN

## 2023-12-24 MED ORDER — ACETAMINOPHEN 325 MG PO TABS: 325.0000 mg | ORAL_TABLET | ORAL | Status: DC | PRN

## 2023-12-24 MED ORDER — ORAL CARE MOUTH RINSE: 15.0000 mL | Freq: Once | OROMUCOSAL | Status: AC

## 2023-12-24 SURGICAL SUPPLY — 29 items
BAG COUNTER SPONGE SURGICOUNT (BAG) IMPLANT
BLADE SURG 15 STRL LF DISP TIS (BLADE) ×1 IMPLANT
BUR CROSS CUT FISSURE 1.6 (BURR) ×1 IMPLANT
BUR EGG ELITE 4.0 (BURR) ×1 IMPLANT
CANISTER SUCT 3000ML PPV (MISCELLANEOUS) ×1 IMPLANT
CNTNR URN SCR LID CUP LEK RST (MISCELLANEOUS) IMPLANT
COVER SURGICAL LIGHT HANDLE (MISCELLANEOUS) ×1 IMPLANT
GAUZE PACKING FOLDED 2 STR (GAUZE/BANDAGES/DRESSINGS) ×1 IMPLANT
GLOVE BIO SURGEON STRL SZ8 (GLOVE) ×1 IMPLANT
GOWN STRL REUS W/ TWL LRG LVL3 (GOWN DISPOSABLE) ×1 IMPLANT
GOWN STRL REUS W/ TWL XL LVL3 (GOWN DISPOSABLE) ×1 IMPLANT
IV NS 1000ML BAXH (IV SOLUTION) ×1 IMPLANT
KIT BASIN OR (CUSTOM PROCEDURE TRAY) ×1 IMPLANT
KIT TURNOVER KIT B (KITS) ×1 IMPLANT
NDL HYPO 25GX1X1/2 BEV (NEEDLE) ×2 IMPLANT
NEEDLE HYPO 25GX1X1/2 BEV (NEEDLE) ×2 IMPLANT
NS IRRIG 1000ML POUR BTL (IV SOLUTION) ×1 IMPLANT
PAD ARMBOARD 7.5X6 YLW CONV (MISCELLANEOUS) ×1 IMPLANT
SLEEVE IRRIGATION ELITE 7 (MISCELLANEOUS) ×1 IMPLANT
SPIKE FLUID TRANSFER (MISCELLANEOUS) ×1 IMPLANT
SPONGE SURGIFOAM ABS GEL 12-7 (HEMOSTASIS) IMPLANT
SUT CHROMIC 3 0 SH 27 (SUTURE) IMPLANT
SUT PLAIN 3 0 PS2 27 (SUTURE) ×1 IMPLANT
SUT SILK 4 0 P 3 (SUTURE) IMPLANT
SYR BULB IRRIG 60ML STRL (SYRINGE) ×1 IMPLANT
SYR CONTROL 10ML LL (SYRINGE) ×1 IMPLANT
TRAY ENT MC OR (CUSTOM PROCEDURE TRAY) ×1 IMPLANT
TUBING IRRIGATION (MISCELLANEOUS) ×1 IMPLANT
YANKAUER SUCT BULB TIP NO VENT (SUCTIONS) ×1 IMPLANT

## 2023-12-24 NOTE — Op Note (Signed)
 NAMEMESSIYAH, Shane Merritt MEDICAL RECORD NO: [id number] ACCOUNT NO: [id number] DATE OF BIRTH: [birth year] FACILITY: [facility] LOCATION: [facility] PHYSICIAN: Georgia Lopes, DDS  Operative Report   DATE OF PROCEDURE: 12/24/2023  PREOPERATIVE DIAGNOSES: 1.  Chronic left maxillary oral atrial fistula. 2.  Nonrestorable teeth numbers 29, 21 and 31 secondary to dental caries.  POSTOPERATIVE DIAGNOSES: 1.  Chronic left maxillary oral atrial fistula. 2.  Nonrestorable teeth numbers 29, 21 and 31 secondary to dental caries.  PROCEDURE:  Extraction of teeth numbers 21, 29, and 31, closure left oral antral communication fistula, excision of fistula, left maxillary sinusotomy.  SURGEON:  Georgia Lopes, DDS  ANESTHESIA:  General, nasal intubation.  Dr. Hart Rochester, attending.  DESCRIPTION OF PROCEDURE:  The patient was taken to the operating room.  General anesthesia was administered and nasal endotracheal tube was placed and secured.  The eyes were protected.  The patient was draped for surgery.  Timeout was performed.  The  posterior pharynx was suctioned and a throat pack was placed.  2% lidocaine 1:100,000 epinephrine was infiltrated in an inferior alveolar nerve block on the right and left sides.  Then, local anesthesia was administered buccally and palatally around the  oral antral fistula.  A bite block was placed on the left side of the mouth.  A Sweetheart was used to retract the tongue.  A #15 blade was used to make an incision around teeth numbers 29 and 31.  The teeth were elevated, but could not be removed.  The  teeth were sectioned and removed in pieces and the roots were removed individually.  Then, the sockets were created, irrigated, and closed with 3-0 chromic.  Then, the bite block was placed on the right side of the mouth and the left side was operated.   Tooth #21 was removed using a 15 blade.  Periosteal elevator, the 301 elevator was not adequate to elevate the tooth, so bone was removed using  a Stryker handpiece.  Then, the tooth was elevated and removed with the dental forceps.  The socket was  curetted, irrigated, and closed with 3-0 chromic.  Attention was then turned to the maxillary fistula.  There was approximately 1 cm x 1 cm opening in the attached gingiva of the left maxillary ridge in the area of tooth #15.  A dental curette was used  to separate the gingival tissue from the fistulous lining of the sinus using smaller and larger curettes.  Then, the fistula was excised with a 15 blade to get clear margins around the opening.  The sinus was curetted and debrided.  Inflammatory tissue  was removed in the polyp.  Then, a buccal vertical releasing incision was made at the proximal and distal edge of the fistula and carried down to the loose mucosa.  Then, periosteal releasing incisions were made and the periosteum was stretched so that  the flap could serve as a buccal tissue flap over the fistula.  Then, an incision was made into the soft tissue beneath the flap and the buccal fat pad was freed.  A large portion of fat was brought over the fistula and then 3-0 chromic sutures were used  to ligate the flap to the medial margin of the fistula and it was tacked to the lateral margins as well with 3-0 chromic.  Then, the buccal flap was closed over the buccal fat pad creating a double-layer closure using 4-0 silk and 3-0 chromic.  The oral  cavity was irrigated and suctioned.  The  throat pack was removed.  The patient was left under the care of anesthesia for extubation and transported to recovery with plans for discharge home through day surgery.  ESTIMATED BLOOD LOSS:  Minimal.  COMPLICATIONS:  None.  SPECIMENS:  Left maxillary sinus lining.  DRAINS:  No drains.  COUNTS:  Correct.   PUS D: 12/24/2023 8:30:15 am T: 12/24/2023 8:59:00 am  JOB: [id number]/ [id number]

## 2023-12-24 NOTE — Transfer of Care (Signed)
 Immediate Anesthesia Transfer of Care Note  Patient: Shane Merritt  Procedure(s) Performed: DENTAL RESTORATION/EXTRACTIONS TWENTY ONE,TWENTY NINE,THIRTYONE, CLOSURE LEFT ORAL ANTRAL COMMUNICATION  FISTULA  Patient Location: PACU  Anesthesia Type:General  Level of Consciousness: drowsy, patient cooperative, and responds to stimulation  Airway & Oxygen Therapy: Patient Spontanous Breathing and Patient connected to nasal cannula oxygen  Post-op Assessment: Report given to RN, Post -op Vital signs reviewed and stable, and Patient moving all extremities  Post vital signs: Reviewed and stable  Last Vitals:  Vitals Value Taken Time  BP 128/84 12/24/23 0831  Temp    Pulse 67 12/24/23 0834  Resp 12 12/24/23 0834  SpO2 99 % 12/24/23 0834  Vitals shown include unfiled device data.  Last Pain:  Vitals:   12/24/23 0611  TempSrc:   PainSc: 0-No pain         Complications: No notable events documented.

## 2023-12-24 NOTE — Anesthesia Procedure Notes (Signed)
 Procedure Name: Intubation Date/Time: 12/24/2023 7:25 AM  Performed by: Stanton Kidney, CRNAPre-anesthesia Checklist: Patient identified, Emergency Drugs available, Suction available, Patient being monitored and Timeout performed Patient Re-evaluated:Patient Re-evaluated prior to induction Oxygen Delivery Method: Circle system utilized Preoxygenation: Pre-oxygenation with 100% oxygen Induction Type: IV induction Ventilation: Mask ventilation without difficulty Laryngoscope Size: Glidescope and 4 Grade View: Grade I Nasal Tubes: Right, Nasal prep performed and Nasal Rae Tube size: 7.0 mm Number of attempts: 1 Airway Equipment and Method: Video-laryngoscopy Placement Confirmation: ETT inserted through vocal cords under direct vision, positive ETCO2 and breath sounds checked- equal and bilateral Tube secured with: Tape Dental Injury: Teeth and Oropharynx as per pre-operative assessment

## 2023-12-24 NOTE — Op Note (Signed)
 12/24/2023  8:24 AM  PATIENT:  Shane Merritt  58 y.o. male  PRE-OPERATIVE DIAGNOSIS:  LEFT MAXILLARY ORAL ANTRAL FISTULA, NON-RESTORABLE TEETH # 21, 29, 31 SECONDARY TO DENTAL CARIES  POST-OPERATIVE DIAGNOSIS:  SAME  PROCEDURE:  Procedure(s): EXTRACTIONS TEETH #'S TWENTY ONE,TWENTY NINE,THIRTYONE, CLOSURE LEFT ORAL ANTRAL COMMUNICATION  FISTULA, LEFT MAXILLARY SINUSOTOMY  SURGEON:  Surgeon(s): Ocie Doyne, DMD  ANESTHESIA:   local and general  EBL:  minimal  DRAINS: none   SPECIMEN:  LEFT MAXILLARY SINUS TISSUE  COUNTS:  YES  PLAN OF CARE: Discharge to home after PACU  PATIENT DISPOSITION:  PACU - hemodynamically stable.   PROCEDURE DETAILS: Dictation #[id number]  Georgia Lopes, DMD 12/24/2023 8:24 AM

## 2023-12-24 NOTE — H&P (Signed)
 H&P documentation  -History and Physical Reviewed  -Patient has been re-examined  -No change in the plan of care  Shane Merritt

## 2023-12-24 NOTE — Anesthesia Postprocedure Evaluation (Signed)
 Anesthesia Post Note  Patient: Linwood Gullikson  Procedure(s) Performed: DENTAL RESTORATION/EXTRACTIONS TWENTY ONE,TWENTY NINE,THIRTYONE, CLOSURE LEFT ORAL ANTRAL COMMUNICATION  FISTULA     Patient location during evaluation: PACU Anesthesia Type: General Level of consciousness: awake and alert Pain management: pain level controlled Vital Signs Assessment: post-procedure vital signs reviewed and stable Respiratory status: spontaneous breathing, nonlabored ventilation, respiratory function stable and patient connected to nasal cannula oxygen Cardiovascular status: blood pressure returned to baseline and stable Postop Assessment: no apparent nausea or vomiting Anesthetic complications: no  There were no known notable events for this encounter.  Last Vitals:  Vitals:   12/24/23 0845 12/24/23 0900  BP: 130/77 132/83  Pulse: 64 64  Resp: 16 14  Temp:  36.4 C  SpO2: 95% 97%    Last Pain:  Vitals:   12/24/23 0900  TempSrc:   PainSc: Asleep                 Shelton Silvas

## 2023-12-25 ENCOUNTER — Encounter (HOSPITAL_COMMUNITY): Payer: Self-pay | Admitting: Oral Surgery

## 2023-12-27 LAB — SURGICAL PATHOLOGY
